# Patient Record
Sex: Male | Born: 1975 | Race: White | Hispanic: No | Smoking: Former smoker
Health system: Southern US, Community
[De-identification: ages and names within clinical notes are randomized; demographics above are authoritative.]

## PROBLEM LIST (undated history)

## (undated) DIAGNOSIS — T7840XA Allergy, unspecified, initial encounter: Secondary | ICD-10-CM

## (undated) DIAGNOSIS — Y249XXA Unspecified firearm discharge, undetermined intent, initial encounter: Secondary | ICD-10-CM

## (undated) DIAGNOSIS — F3132 Bipolar disorder, current episode depressed, moderate: Secondary | ICD-10-CM

## (undated) DIAGNOSIS — F419 Anxiety disorder, unspecified: Secondary | ICD-10-CM

## (undated) DIAGNOSIS — F329 Major depressive disorder, single episode, unspecified: Secondary | ICD-10-CM

## (undated) DIAGNOSIS — G8929 Other chronic pain: Secondary | ICD-10-CM

## (undated) DIAGNOSIS — F32A Depression, unspecified: Secondary | ICD-10-CM

## (undated) DIAGNOSIS — W3400XA Accidental discharge from unspecified firearms or gun, initial encounter: Secondary | ICD-10-CM

## (undated) DIAGNOSIS — F319 Bipolar disorder, unspecified: Secondary | ICD-10-CM

## (undated) DIAGNOSIS — K219 Gastro-esophageal reflux disease without esophagitis: Secondary | ICD-10-CM

## (undated) DIAGNOSIS — G43909 Migraine, unspecified, not intractable, without status migrainosus: Secondary | ICD-10-CM

## (undated) DIAGNOSIS — M549 Dorsalgia, unspecified: Secondary | ICD-10-CM

## (undated) DIAGNOSIS — I10 Essential (primary) hypertension: Secondary | ICD-10-CM

## (undated) HISTORY — PX: OTHER SURGICAL HISTORY: SHX169

## (undated) HISTORY — DX: Allergy, unspecified, initial encounter: T78.40XA

## (undated) HISTORY — PX: BACK SURGERY: SHX140

---

## 1999-04-06 ENCOUNTER — Encounter: Payer: Self-pay | Admitting: Emergency Medicine

## 1999-04-06 ENCOUNTER — Emergency Department (HOSPITAL_COMMUNITY): Admission: EM | Admit: 1999-04-06 | Discharge: 1999-04-06 | Payer: Self-pay

## 2010-10-21 ENCOUNTER — Emergency Department (HOSPITAL_COMMUNITY): Payer: Self-pay

## 2010-10-21 ENCOUNTER — Emergency Department (HOSPITAL_COMMUNITY)
Admission: EM | Admit: 2010-10-21 | Discharge: 2010-10-21 | Disposition: A | Discharge status: Home / Self Care | Payer: Self-pay | Attending: Emergency Medicine | Admitting: Emergency Medicine

## 2010-10-21 DIAGNOSIS — R110 Nausea: Secondary | ICD-10-CM | POA: Insufficient documentation

## 2010-10-21 DIAGNOSIS — K2990 Gastroduodenitis, unspecified, without bleeding: Secondary | ICD-10-CM | POA: Insufficient documentation

## 2010-10-21 DIAGNOSIS — K2970 Gastritis, unspecified, without bleeding: Secondary | ICD-10-CM | POA: Insufficient documentation

## 2010-10-21 DIAGNOSIS — J029 Acute pharyngitis, unspecified: Secondary | ICD-10-CM | POA: Insufficient documentation

## 2010-10-21 DIAGNOSIS — M542 Cervicalgia: Secondary | ICD-10-CM | POA: Insufficient documentation

## 2010-10-21 DIAGNOSIS — I10 Essential (primary) hypertension: Secondary | ICD-10-CM | POA: Insufficient documentation

## 2010-10-21 DIAGNOSIS — R0789 Other chest pain: Secondary | ICD-10-CM | POA: Insufficient documentation

## 2010-10-21 DIAGNOSIS — I498 Other specified cardiac arrhythmias: Secondary | ICD-10-CM | POA: Insufficient documentation

## 2010-10-21 DIAGNOSIS — R1013 Epigastric pain: Secondary | ICD-10-CM | POA: Insufficient documentation

## 2010-10-21 LAB — CBC
HCT: 43 % (ref 39.0–52.0)
Hemoglobin: 14.7 g/dL (ref 13.0–17.0)
MCH: 28.3 pg (ref 26.0–34.0)
MCHC: 34.2 g/dL (ref 30.0–36.0)
MCV: 82.7 fL (ref 78.0–100.0)
Platelets: 199 10*3/uL (ref 150–400)
RBC: 5.2 MIL/uL (ref 4.22–5.81)
RDW: 14 % (ref 11.5–15.5)
WBC: 7.6 10*3/uL (ref 4.0–10.5)

## 2010-10-21 LAB — OCCULT BLOOD, POC DEVICE: Fecal Occult Bld: NEGATIVE

## 2010-10-21 LAB — POCT I-STAT, CHEM 8
BUN: 17 mg/dL (ref 6–23)
Calcium, Ion: 1.19 mmol/L (ref 1.12–1.32)
Chloride: 101 mEq/L (ref 96–112)
Creatinine, Ser: 1 mg/dL (ref 0.4–1.5)
Glucose, Bld: 92 mg/dL (ref 70–99)
HCT: 45 % (ref 39.0–52.0)
Hemoglobin: 15.3 g/dL (ref 13.0–17.0)
Potassium: 4.2 mEq/L (ref 3.5–5.1)
Sodium: 137 mEq/L (ref 135–145)
TCO2: 29 mmol/L (ref 0–100)

## 2010-10-21 LAB — POCT CARDIAC MARKERS
CKMB, poc: 1.8 ng/mL (ref 1.0–8.0)
Myoglobin, poc: 48.8 ng/mL (ref 12–200)
Troponin i, poc: <0.05 ng/mL (ref 0.00–0.09)

## 2011-04-22 ENCOUNTER — Emergency Department (HOSPITAL_COMMUNITY): Payer: PRIVATE HEALTH INSURANCE

## 2011-04-22 ENCOUNTER — Encounter (HOSPITAL_COMMUNITY): Payer: Self-pay | Admitting: Radiology

## 2011-04-22 ENCOUNTER — Emergency Department (HOSPITAL_COMMUNITY)
Admission: EM | Admit: 2011-04-22 | Discharge: 2011-04-22 | Disposition: A | Discharge status: Home / Self Care | Payer: PRIVATE HEALTH INSURANCE | Attending: Emergency Medicine | Admitting: Emergency Medicine

## 2011-04-22 DIAGNOSIS — K5900 Constipation, unspecified: Secondary | ICD-10-CM | POA: Insufficient documentation

## 2011-04-22 DIAGNOSIS — M545 Low back pain, unspecified: Secondary | ICD-10-CM | POA: Insufficient documentation

## 2011-04-22 DIAGNOSIS — I10 Essential (primary) hypertension: Secondary | ICD-10-CM | POA: Insufficient documentation

## 2011-04-22 DIAGNOSIS — M5126 Other intervertebral disc displacement, lumbar region: Secondary | ICD-10-CM | POA: Insufficient documentation

## 2011-04-22 DIAGNOSIS — Z79899 Other long term (current) drug therapy: Secondary | ICD-10-CM | POA: Insufficient documentation

## 2011-04-22 DIAGNOSIS — G8929 Other chronic pain: Secondary | ICD-10-CM | POA: Insufficient documentation

## 2011-04-22 DIAGNOSIS — R109 Unspecified abdominal pain: Secondary | ICD-10-CM | POA: Insufficient documentation

## 2011-04-22 HISTORY — DX: Dorsalgia, unspecified: M54.9

## 2011-04-22 HISTORY — DX: Unspecified firearm discharge, undetermined intent, initial encounter: Y24.9XXA

## 2011-04-22 HISTORY — DX: Other chronic pain: G89.29

## 2011-04-22 HISTORY — DX: Essential (primary) hypertension: I10

## 2011-04-22 HISTORY — DX: Accidental discharge from unspecified firearms or gun, initial encounter: W34.00XA

## 2011-04-22 LAB — DIFFERENTIAL
Basophils Relative: 0 % (ref 0–1)
Eosinophils Absolute: 0.1 10*3/uL (ref 0.0–0.7)
Lymphs Abs: 1.6 10*3/uL (ref 0.7–4.0)
Monocytes Absolute: 0.6 10*3/uL (ref 0.1–1.0)
Monocytes Relative: 5 % (ref 3–12)
Neutro Abs: 9.2 10*3/uL — ABNORMAL HIGH (ref 1.7–7.7)

## 2011-04-22 LAB — POCT I-STAT, CHEM 8
BUN: 20 mg/dL (ref 6–23)
Calcium, Ion: 1.22 mmol/L (ref 1.12–1.32)
Chloride: 103 mEq/L (ref 96–112)
Creatinine, Ser: 0.7 mg/dL (ref 0.50–1.35)
TCO2: 27 mmol/L (ref 0–100)

## 2011-04-22 LAB — CBC
Hemoglobin: 16.9 g/dL (ref 13.0–17.0)
MCH: 29.4 pg (ref 26.0–34.0)
MCHC: 36.5 g/dL — ABNORMAL HIGH (ref 30.0–36.0)
MCV: 80.5 fL (ref 78.0–100.0)

## 2011-04-22 LAB — URINALYSIS, ROUTINE W REFLEX MICROSCOPIC
Glucose, UA: NEGATIVE mg/dL
Hgb urine dipstick: NEGATIVE
Ketones, ur: NEGATIVE mg/dL
Protein, ur: NEGATIVE mg/dL
pH: 6 (ref 5.0–8.0)

## 2011-07-15 ENCOUNTER — Encounter (HOSPITAL_COMMUNITY): Payer: Self-pay

## 2011-07-15 ENCOUNTER — Encounter (HOSPITAL_COMMUNITY): Payer: Self-pay | Admitting: Pharmacy Technician

## 2011-07-15 ENCOUNTER — Encounter (HOSPITAL_COMMUNITY)
Admission: RE | Admit: 2011-07-15 | Discharge: 2011-07-15 | Disposition: A | Discharge status: Home / Self Care | Payer: PRIVATE HEALTH INSURANCE | Source: Ambulatory Visit | Attending: Neurological Surgery | Admitting: Neurological Surgery

## 2011-07-15 HISTORY — DX: Anxiety disorder, unspecified: F41.9

## 2011-07-15 HISTORY — DX: Gastro-esophageal reflux disease without esophagitis: K21.9

## 2011-07-15 HISTORY — DX: Bipolar disorder, unspecified: F31.9

## 2011-07-15 HISTORY — DX: Depression, unspecified: F32.A

## 2011-07-15 HISTORY — DX: Major depressive disorder, single episode, unspecified: F32.9

## 2011-07-15 HISTORY — DX: Migraine, unspecified, not intractable, without status migrainosus: G43.909

## 2011-07-15 LAB — DIFFERENTIAL
Basophils Absolute: 0 10*3/uL (ref 0.0–0.1)
Basophils Relative: 0 % (ref 0–1)
Eosinophils Absolute: 0.2 10*3/uL (ref 0.0–0.7)
Eosinophils Relative: 4 % (ref 0–5)
Lymphocytes Relative: 38 % (ref 12–46)
Monocytes Absolute: 0.4 10*3/uL (ref 0.1–1.0)

## 2011-07-15 LAB — CBC
HCT: 41.7 % (ref 39.0–52.0)
MCH: 29.6 pg (ref 26.0–34.0)
MCHC: 35.3 g/dL (ref 30.0–36.0)
MCV: 84.1 fL (ref 78.0–100.0)
Platelets: 171 10*3/uL (ref 150–400)
RDW: 12.8 % (ref 11.5–15.5)
WBC: 5.6 10*3/uL (ref 4.0–10.5)

## 2011-07-15 LAB — BASIC METABOLIC PANEL
BUN: 6 mg/dL (ref 6–23)
CO2: 29 mEq/L (ref 19–32)
Calcium: 9.1 mg/dL (ref 8.4–10.5)
Chloride: 104 mEq/L (ref 96–112)
Creatinine, Ser: 0.77 mg/dL (ref 0.50–1.35)

## 2011-07-15 LAB — PROTIME-INR: Prothrombin Time: 14.8 seconds (ref 11.6–15.2)

## 2011-07-15 LAB — TYPE AND SCREEN: Antibody Screen: NEGATIVE

## 2011-07-15 LAB — SURGICAL PCR SCREEN: Staphylococcus aureus: NEGATIVE

## 2011-07-15 LAB — ABO/RH: ABO/RH(D): O POS

## 2011-07-15 LAB — APTT: aPTT: 31 seconds (ref 24–37)
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
## 2011-07-15 NOTE — Pre-Procedure Instructions (Signed)
20 Kwabena Strutz  07/15/2011   Your procedure is scheduled on:  December 20 [facility], Thursday  Report to [facility] at  12 NOON                                                                                                                                                                                                                   CALL THIS NUMBER IF YOU ARE UNABLE TO MAKE IT......[phone]   Remember:   Do not eat food:After Midnight  .  May have clear liquids: up to 4 Hours before arrival  Time  (8:00) am.  Clear liquids include soda, tea, black coffee, apple or grape juice, broth.  Take these medicines the morning of surgery with A SIP OF WATER: FLEXERIL              OXYCONTIN, PAXIL**   Do not wear jewelry, make-up or nail polish.  Do not wear lotions, powders, or perfumes. You may wear deodorant.  Do not shave 48 hours prior to surgery.  Do not bring valuables to the hospital.  Contacts, dentures or bridgework may not be worn into surgery.  Leave suitcase in the car. After surgery it may be brought to your room.  For patients admitted to the hospital, checkout time is 11:00 AM the day of discharge.   Patients discharged the day of surgery will not be allowed to drive home.  Name and phone number of your driver: SUSAN  Dicola  -- MOTHER  Special Instructions: CHG Shower Use Special Wash: 1/2 bottle night before surgery and 1/2 bottle morning of surgery.   Please read over the following fact sheets that you were given: Pain Booklet, MRSA Information and Surgical Site Infection Prevention

## 2011-07-21 MED ORDER — VANCOMYCIN HCL 500 MG IV SOLR
500.0000 mg | Freq: Once | INTRAVENOUS | Status: AC
  Administered 2011-07-22: 500 mg via INTRAVENOUS
  Filled 2011-07-21: qty 500

## 2011-07-22 ENCOUNTER — Encounter (HOSPITAL_COMMUNITY): Payer: Self-pay | Admitting: Neurological Surgery

## 2011-07-22 ENCOUNTER — Encounter (HOSPITAL_COMMUNITY)
Admission: RE | Disposition: A | Discharge status: Home / Self Care | Payer: Self-pay | Source: Ambulatory Visit | Attending: Neurological Surgery

## 2011-07-22 ENCOUNTER — Encounter (HOSPITAL_COMMUNITY): Payer: Self-pay | Admitting: Anesthesiology

## 2011-07-22 ENCOUNTER — Inpatient Hospital Stay (HOSPITAL_COMMUNITY)
Admission: RE | Admit: 2011-07-22 | Discharge: 2011-07-25 | DRG: 460 | Disposition: A | Discharge status: Home / Self Care | Payer: PRIVATE HEALTH INSURANCE | Source: Ambulatory Visit | Attending: Neurological Surgery | Admitting: Neurological Surgery

## 2011-07-22 ENCOUNTER — Ambulatory Visit (HOSPITAL_COMMUNITY): Payer: PRIVATE HEALTH INSURANCE

## 2011-07-22 ENCOUNTER — Ambulatory Visit (HOSPITAL_COMMUNITY): Payer: PRIVATE HEALTH INSURANCE | Admitting: Anesthesiology

## 2011-07-22 ENCOUNTER — Other Ambulatory Visit: Payer: Self-pay

## 2011-07-22 DIAGNOSIS — G43909 Migraine, unspecified, not intractable, without status migrainosus: Secondary | ICD-10-CM | POA: Diagnosis present

## 2011-07-22 DIAGNOSIS — M5126 Other intervertebral disc displacement, lumbar region: Secondary | ICD-10-CM | POA: Diagnosis present

## 2011-07-22 DIAGNOSIS — F3130 Bipolar disorder, current episode depressed, mild or moderate severity, unspecified: Secondary | ICD-10-CM | POA: Diagnosis present

## 2011-07-22 DIAGNOSIS — G8929 Other chronic pain: Secondary | ICD-10-CM | POA: Diagnosis present

## 2011-07-22 DIAGNOSIS — M47817 Spondylosis without myelopathy or radiculopathy, lumbosacral region: Principal | ICD-10-CM | POA: Diagnosis present

## 2011-07-22 DIAGNOSIS — F17200 Nicotine dependence, unspecified, uncomplicated: Secondary | ICD-10-CM | POA: Diagnosis present

## 2011-07-22 DIAGNOSIS — Z23 Encounter for immunization: Secondary | ICD-10-CM

## 2011-07-22 DIAGNOSIS — M51379 Other intervertebral disc degeneration, lumbosacral region without mention of lumbar back pain or lower extremity pain: Secondary | ICD-10-CM | POA: Diagnosis present

## 2011-07-22 DIAGNOSIS — K219 Gastro-esophageal reflux disease without esophagitis: Secondary | ICD-10-CM | POA: Diagnosis present

## 2011-07-22 DIAGNOSIS — Z981 Arthrodesis status: Secondary | ICD-10-CM

## 2011-07-22 DIAGNOSIS — Z01812 Encounter for preprocedural laboratory examination: Secondary | ICD-10-CM

## 2011-07-22 DIAGNOSIS — F411 Generalized anxiety disorder: Secondary | ICD-10-CM | POA: Diagnosis present

## 2011-07-22 DIAGNOSIS — M5137 Other intervertebral disc degeneration, lumbosacral region: Secondary | ICD-10-CM | POA: Diagnosis present

## 2011-07-22 HISTORY — DX: Bipolar disorder, current episode depressed, moderate: F31.32

## 2011-07-22 SURGERY — POSTERIOR LUMBAR FUSION 2 LEVEL
Anesthesia: General | Site: Back | Wound class: Clean

## 2011-07-22 MED ORDER — HYDROMORPHONE HCL PF 1 MG/ML IJ SOLN
INTRAMUSCULAR | Status: AC
  Administered 2011-07-22 (×2): 0.5 mg
  Filled 2011-07-22: qty 1

## 2011-07-22 MED ORDER — MENTHOL 3 MG MT LOZG: 1.0000 | LOZENGE | OROMUCOSAL | Status: DC | PRN

## 2011-07-22 MED ORDER — ONDANSETRON HCL 4 MG/2ML IJ SOLN
INTRAMUSCULAR | Status: DC | PRN
  Administered 2011-07-22: 4 mg via INTRAVENOUS

## 2011-07-22 MED ORDER — PROPOFOL 10 MG/ML IV EMUL
INTRAVENOUS | Status: DC | PRN
  Administered 2011-07-22: 200 mg via INTRAVENOUS

## 2011-07-22 MED ORDER — DEXAMETHASONE SODIUM PHOSPHATE 10 MG/ML IJ SOLN
INTRAMUSCULAR | Status: DC | PRN
  Administered 2011-07-22: 10 mg via INTRAVENOUS

## 2011-07-22 MED ORDER — SENNA 8.6 MG PO TABS
1.0000 | ORAL_TABLET | Freq: Two times a day (BID) | ORAL | Status: DC
  Administered 2011-07-22 – 2011-07-25 (×6): 8.6 mg via ORAL
  Filled 2011-07-22 (×7): qty 1

## 2011-07-22 MED ORDER — ALPRAZOLAM 0.5 MG PO TABS
1.0000 mg | ORAL_TABLET | Freq: Two times a day (BID) | ORAL | Status: DC
  Administered 2011-07-22 – 2011-07-25 (×6): 1 mg via ORAL
  Filled 2011-07-22 (×2): qty 1
  Filled 2011-07-22: qty 2
  Filled 2011-07-22: qty 1
  Filled 2011-07-22: qty 2
  Filled 2011-07-22 (×2): qty 1
  Filled 2011-07-22: qty 2
  Filled 2011-07-22: qty 1

## 2011-07-22 MED ORDER — DEXAMETHASONE 4 MG PO TABS
4.0000 mg | ORAL_TABLET | Freq: Four times a day (QID) | ORAL | Status: DC
  Administered 2011-07-23 – 2011-07-25 (×11): 4 mg via ORAL
  Filled 2011-07-22 (×15): qty 1

## 2011-07-22 MED ORDER — ONDANSETRON HCL 4 MG/2ML IJ SOLN: 4.0000 mg | INTRAMUSCULAR | Status: DC | PRN

## 2011-07-22 MED ORDER — HEMOSTATIC AGENTS (NO CHARGE) OPTIME
TOPICAL | Status: DC | PRN
  Administered 2011-07-22: 1 via TOPICAL

## 2011-07-22 MED ORDER — SODIUM CHLORIDE 0.9 % IR SOLN
Status: DC | PRN
  Administered 2011-07-22: 14:00:00

## 2011-07-22 MED ORDER — LACTATED RINGERS IV SOLN
INTRAVENOUS | Status: DC | PRN
  Administered 2011-07-22 (×3): via INTRAVENOUS

## 2011-07-22 MED ORDER — DIPHENHYDRAMINE HCL 50 MG/ML IJ SOLN: 12.5000 mg | Freq: Four times a day (QID) | INTRAMUSCULAR | Status: DC | PRN

## 2011-07-22 MED ORDER — NEOSTIGMINE METHYLSULFATE 1 MG/ML IJ SOLN
INTRAMUSCULAR | Status: DC | PRN
  Administered 2011-07-22: 4 mg via INTRAVENOUS

## 2011-07-22 MED ORDER — DEXAMETHASONE SODIUM PHOSPHATE 4 MG/ML IJ SOLN
4.0000 mg | Freq: Four times a day (QID) | INTRAMUSCULAR | Status: DC
  Filled 2011-07-22 (×15): qty 1

## 2011-07-22 MED ORDER — ACETAMINOPHEN 10 MG/ML IV SOLN
INTRAVENOUS | Status: AC
  Filled 2011-07-22: qty 100

## 2011-07-22 MED ORDER — VANCOMYCIN HCL IN DEXTROSE 1-5 GM/200ML-% IV SOLN
1000.0000 mg | Freq: Two times a day (BID) | INTRAVENOUS | Status: DC
  Administered 2011-07-22 – 2011-07-24 (×5): 1000 mg via INTRAVENOUS
  Filled 2011-07-22 (×7): qty 200

## 2011-07-22 MED ORDER — ROCURONIUM BROMIDE 100 MG/10ML IV SOLN
INTRAVENOUS | Status: DC | PRN
  Administered 2011-07-22 (×2): 10 mg via INTRAVENOUS
  Administered 2011-07-22: 50 mg via INTRAVENOUS
  Administered 2011-07-22 (×2): 10 mg via INTRAVENOUS
  Administered 2011-07-22: 20 mg via INTRAVENOUS
  Administered 2011-07-22: 10 mg via INTRAVENOUS

## 2011-07-22 MED ORDER — SODIUM CHLORIDE 0.9 % IV SOLN
INTRAVENOUS | Status: AC
  Filled 2011-07-22: qty 500

## 2011-07-22 MED ORDER — HYDROMORPHONE 0.3 MG/ML IV SOLN
INTRAVENOUS | Status: DC
  Administered 2011-07-22: 17:00:00 via INTRAVENOUS
  Administered 2011-07-22: 3.3 mg via INTRAVENOUS
  Administered 2011-07-23: 3.9 mg via INTRAVENOUS
  Administered 2011-07-23: 1.6 mg via INTRAVENOUS
  Administered 2011-07-23: 3.3 mg via INTRAVENOUS
  Administered 2011-07-23: 3.16 mg via INTRAVENOUS
  Administered 2011-07-23: 3.9 mg via INTRAVENOUS
  Administered 2011-07-23 (×2): via INTRAVENOUS
  Filled 2011-07-22 (×3): qty 25

## 2011-07-22 MED ORDER — GLYCOPYRROLATE 0.2 MG/ML IJ SOLN
INTRAMUSCULAR | Status: DC | PRN
  Administered 2011-07-22: .8 mg via INTRAVENOUS

## 2011-07-22 MED ORDER — BUPIVACAINE HCL (PF) 0.25 % IJ SOLN
INTRAMUSCULAR | Status: DC | PRN
  Administered 2011-07-22: 8 mL

## 2011-07-22 MED ORDER — MIDAZOLAM HCL 5 MG/5ML IJ SOLN
INTRAMUSCULAR | Status: DC | PRN
  Administered 2011-07-22: 2 mg via INTRAVENOUS

## 2011-07-22 MED ORDER — SODIUM CHLORIDE 0.9 % IV SOLN
INTRAVENOUS | Status: DC | PRN
  Administered 2011-07-22: 15:00:00 via INTRAVENOUS

## 2011-07-22 MED ORDER — THROMBIN 20000 UNITS EX KIT
PACK | CUTANEOUS | Status: DC | PRN
  Administered 2011-07-22: 20000 [IU] via TOPICAL

## 2011-07-22 MED ORDER — CYCLOBENZAPRINE HCL 10 MG PO TABS
10.0000 mg | ORAL_TABLET | Freq: Three times a day (TID) | ORAL | Status: DC | PRN
  Administered 2011-07-22 – 2011-07-24 (×4): 10 mg via ORAL
  Filled 2011-07-22 (×3): qty 1

## 2011-07-22 MED ORDER — 0.9 % SODIUM CHLORIDE (POUR BTL) OPTIME
TOPICAL | Status: DC | PRN
  Administered 2011-07-22: 1000 mL

## 2011-07-22 MED ORDER — SUFENTANIL CITRATE 50 MCG/ML IV SOLN
INTRAVENOUS | Status: DC | PRN
  Administered 2011-07-22 (×2): 10 ug via INTRAVENOUS
  Administered 2011-07-22 (×2): 20 ug via INTRAVENOUS
  Administered 2011-07-22: 10 ug via INTRAVENOUS

## 2011-07-22 MED ORDER — SODIUM CHLORIDE 0.9 % IV SOLN: 250.0000 mL | INTRAVENOUS | Status: DC

## 2011-07-22 MED ORDER — POTASSIUM CHLORIDE IN NACL 20-0.9 MEQ/L-% IV SOLN
INTRAVENOUS | Status: DC
  Administered 2011-07-22 – 2011-07-23 (×2): via INTRAVENOUS
  Filled 2011-07-22 (×6): qty 1000

## 2011-07-22 MED ORDER — OXYCODONE-ACETAMINOPHEN 5-325 MG PO TABS
ORAL_TABLET | ORAL | Status: AC
  Administered 2011-07-22: 2
  Filled 2011-07-22: qty 2

## 2011-07-22 MED ORDER — THROMBIN 20000 UNITS EX KIT
PACK | CUTANEOUS | Status: DC | PRN
  Administered 2011-07-22: 16:00:00 via TOPICAL

## 2011-07-22 MED ORDER — ZOLPIDEM TARTRATE 10 MG PO TABS
10.0000 mg | ORAL_TABLET | Freq: Every evening | ORAL | Status: DC | PRN
  Administered 2011-07-23: 10 mg via ORAL
  Filled 2011-07-22: qty 1

## 2011-07-22 MED ORDER — DIPHENHYDRAMINE HCL 12.5 MG/5ML PO ELIX: 12.5000 mg | ORAL_SOLUTION | Freq: Four times a day (QID) | ORAL | Status: DC | PRN

## 2011-07-22 MED ORDER — ACETAMINOPHEN 650 MG RE SUPP: 650.0000 mg | RECTAL | Status: DC | PRN

## 2011-07-22 MED ORDER — CYCLOBENZAPRINE HCL 10 MG PO TABS
ORAL_TABLET | ORAL | Status: AC
  Filled 2011-07-22: qty 1

## 2011-07-22 MED ORDER — NALOXONE HCL 0.4 MG/ML IJ SOLN: 0.4000 mg | INTRAMUSCULAR | Status: DC | PRN

## 2011-07-22 MED ORDER — THROMBIN 5000 UNITS EX KIT
PACK | OROMUCOSAL | Status: DC | PRN
  Administered 2011-07-22 (×2): via TOPICAL

## 2011-07-22 MED ORDER — PHENOL 1.4 % MT LIQD: 1.0000 | OROMUCOSAL | Status: DC | PRN

## 2011-07-22 MED ORDER — SODIUM CHLORIDE 0.9 % IJ SOLN: 9.0000 mL | INTRAMUSCULAR | Status: DC | PRN

## 2011-07-22 MED ORDER — PAROXETINE HCL 20 MG PO TABS
20.0000 mg | ORAL_TABLET | Freq: Every day | ORAL | Status: DC
  Administered 2011-07-23 – 2011-07-24 (×2): 40 mg via ORAL
  Filled 2011-07-22 (×2): qty 2

## 2011-07-22 MED ORDER — ONDANSETRON HCL 4 MG/2ML IJ SOLN: 4.0000 mg | Freq: Four times a day (QID) | INTRAMUSCULAR | Status: DC | PRN

## 2011-07-22 MED ORDER — SODIUM CHLORIDE 0.9 % IJ SOLN
3.0000 mL | INTRAMUSCULAR | Status: DC | PRN
  Administered 2011-07-22: 3 mL via INTRAVENOUS

## 2011-07-22 MED ORDER — ACETAMINOPHEN 10 MG/ML IV SOLN
INTRAVENOUS | Status: DC | PRN
  Administered 2011-07-22: 1000 mg via INTRAVENOUS

## 2011-07-22 MED ORDER — ACETAMINOPHEN 325 MG PO TABS: 650.0000 mg | ORAL_TABLET | ORAL | Status: DC | PRN

## 2011-07-22 MED ORDER — BACITRACIN 50000 UNITS IM SOLR
INTRAMUSCULAR | Status: AC
  Filled 2011-07-22: qty 50000

## 2011-07-22 MED ORDER — SODIUM CHLORIDE 0.9 % IJ SOLN
3.0000 mL | Freq: Two times a day (BID) | INTRAMUSCULAR | Status: DC
  Administered 2011-07-22: 3 mL via INTRAVENOUS

## 2011-07-22 MED ORDER — OXYCODONE-ACETAMINOPHEN 5-325 MG PO TABS
1.0000 | ORAL_TABLET | ORAL | Status: DC | PRN
  Administered 2011-07-22 – 2011-07-23 (×2): 2 via ORAL
  Administered 2011-07-23: 1 via ORAL
  Administered 2011-07-23: 2 via ORAL
  Administered 2011-07-23: 1 via ORAL
  Administered 2011-07-24 – 2011-07-25 (×4): 2 via ORAL
  Filled 2011-07-22 (×9): qty 2

## 2011-07-22 SURGICAL SUPPLY — 69 items
APL SKNCLS STERI-STRIP NONHPOA (GAUZE/BANDAGES/DRESSINGS) ×1
BAG DECANTER FOR FLEXI CONT (MISCELLANEOUS) ×2 IMPLANT
BENZOIN TINCTURE PRP APPL 2/3 (GAUZE/BANDAGES/DRESSINGS) ×2 IMPLANT
BLADE SURG ROTATE 9660 (MISCELLANEOUS) ×1 IMPLANT
BUR MATCHSTICK NEURO 3.0 LAGG (BURR) ×3 IMPLANT
CANISTER SUCTION 2500CC (MISCELLANEOUS) ×2 IMPLANT
CLOSURE STERI STRIP 1/2 X4 (GAUZE/BANDAGES/DRESSINGS) ×1 IMPLANT
CLOTH BEACON ORANGE TIMEOUT ST (SAFETY) ×2 IMPLANT
CONT SPEC 4OZ CLIKSEAL STRL BL (MISCELLANEOUS) ×4 IMPLANT
COVER BACK TABLE 24X17X13 BIG (DRAPES) ×1 IMPLANT
COVER TABLE BACK 60X90 (DRAPES) ×1 IMPLANT
DRAPE C-ARM 42X72 X-RAY (DRAPES) ×5 IMPLANT
DRAPE LAPAROTOMY 100X72X124 (DRAPES) ×2 IMPLANT
DRAPE POUCH INSTRU U-SHP 10X18 (DRAPES) ×2 IMPLANT
DRAPE SURG 17X23 STRL (DRAPES) ×2 IMPLANT
DRESSING TELFA 8X3 (GAUZE/BANDAGES/DRESSINGS) ×2 IMPLANT
DRSG OPSITE 4X5.5 SM (GAUZE/BANDAGES/DRESSINGS) ×3 IMPLANT
DURAPREP 26ML APPLICATOR (WOUND CARE) ×2 IMPLANT
ELECT REM PT RETURN 9FT ADLT (ELECTROSURGICAL) ×2
ELECTRODE REM PT RTRN 9FT ADLT (ELECTROSURGICAL) ×1 IMPLANT
EVACUATOR 1/8 PVC DRAIN (DRAIN) ×2 IMPLANT
GAUZE SPONGE 4X4 16PLY XRAY LF (GAUZE/BANDAGES/DRESSINGS) ×2 IMPLANT
GLOVE BIO SURGEON STRL SZ8 (GLOVE) ×4 IMPLANT
GLOVE BIOGEL PI IND STRL 7.0 (GLOVE) IMPLANT
GLOVE BIOGEL PI INDICATOR 7.0 (GLOVE) ×1
GLOVE ECLIPSE 7.5 STRL STRAW (GLOVE) ×2 IMPLANT
GLOVE ECLIPSE 8.0 STRL XLNG CF (GLOVE) ×2 IMPLANT
GLOVE ECLIPSE 8.5 STRL (GLOVE) IMPLANT
GLOVE EXAM NITRILE MD LF STRL (GLOVE) ×1 IMPLANT
GLOVE SURG SS PI 6.5 STRL IVOR (GLOVE) ×5 IMPLANT
GOWN BRE IMP SLV AUR LG STRL (GOWN DISPOSABLE) ×2 IMPLANT
GOWN BRE IMP SLV AUR XL STRL (GOWN DISPOSABLE) ×4 IMPLANT
GOWN STRL REIN 2XL LVL4 (GOWN DISPOSABLE) ×1 IMPLANT
GRAFT BN 5X1XSPNE CVD POST DBM (Bone Implant) IMPLANT
GRAFT BONE MAGNIFUSE 1X5CM (Bone Implant) ×2 IMPLANT
HEMOSTAT POWDER KIT SURGIFOAM (HEMOSTASIS) IMPLANT
KIT BASIN OR (CUSTOM PROCEDURE TRAY) ×2 IMPLANT
KIT ROOM TURNOVER OR (KITS) ×2 IMPLANT
MILL MEDIUM DISP (BLADE) ×1 IMPLANT
NDL HYPO 25X1 1.5 SAFETY (NEEDLE) ×1 IMPLANT
NEEDLE HYPO 25X1 1.5 SAFETY (NEEDLE) ×2 IMPLANT
NS IRRIG 1000ML POUR BTL (IV SOLUTION) ×2 IMPLANT
PACK LAMINECTOMY NEURO (CUSTOM PROCEDURE TRAY) ×2 IMPLANT
PAD ARMBOARD 7.5X6 YLW CONV (MISCELLANEOUS) ×8 IMPLANT
PUTTY 5ML ACTIFUSE ABX (Putty) ×1 IMPLANT
ROD TI 5.5X70 (Rod) ×2 IMPLANT
SCREW LOCK (Screw) ×12 IMPLANT
SCREW LOCK 100X5.5X OPN (Screw) IMPLANT
SCREW POLY 45X6.5 (Screw) IMPLANT
SCREW POLY 6.5X35 (Screw) ×2 IMPLANT
SCREW POLY 6.5X45MM (Screw) ×8 IMPLANT
SPONGE LAP 4X18 X RAY DECT (DISPOSABLE) ×1 IMPLANT
SPONGE SURGIFOAM ABS GEL 100 (HEMOSTASIS) ×2 IMPLANT
STRIP CLOSURE SKIN 1/2X4 (GAUZE/BANDAGES/DRESSINGS) ×4 IMPLANT
SUT VIC AB 0 CT1 18XCR BRD8 (SUTURE) ×1 IMPLANT
SUT VIC AB 0 CT1 8-18 (SUTURE) ×4
SUT VIC AB 2-0 CP2 18 (SUTURE) ×3 IMPLANT
SUT VIC AB 3-0 SH 8-18 (SUTURE) ×4 IMPLANT
SYR 20ML ECCENTRIC (SYRINGE) ×2 IMPLANT
TAPE CLOTH SURG 4X10 WHT LF (GAUZE/BANDAGES/DRESSINGS) ×1 IMPLANT
TELAMON 12X26 (Cage) ×1 IMPLANT
TOWEL OR 17X24 6PK STRL BLUE (TOWEL DISPOSABLE) ×2 IMPLANT
TOWEL OR 17X26 10 PK STRL BLUE (TOWEL DISPOSABLE) ×2 IMPLANT
TRAP SPECIMEN MUCOUS 40CC (MISCELLANEOUS) ×1 IMPLANT
TRAY FOLEY CATH 14FRSI W/METER (CATHETERS) ×2 IMPLANT
Telemon cage (Screw) ×1 IMPLANT
WATER STERILE IRR 1000ML POUR (IV SOLUTION) ×2 IMPLANT
WEDGE TANGENT 12X26MM ×1 IMPLANT
WEDGE TANGENT 8X26MM ×1 IMPLANT

## 2011-07-22 NOTE — Op Note (Signed)
07/22/2011  5:01 PM  PATIENT:  [name]  35 y.o. 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 Decompressive laminectomy L4 and L5 decompression of the L4, L5 and S1 nerve roots requiring more work than would be required of a simple PLIF procedure in order to adequately decompress the neural elements, 2. Posterior lumbar interbody fusion L4-5 L5-S1 utilizing peek interbody cages packed with local autograft and morcellized allograft, and tangent interbody bone wedges. 3. Posterior lateral intertransverse arthrodesis utilizing local autograft and morcellized L4-S1, 4. Segmental fixation L4-S1 utilizing nuvasive pedicle screws  SURGEON:  Marikay Alar, MD  ASSISTANTS: Kritzer  ANESTHESIA:   General  EBL: 2200 ml  Total I/O In: 4260 [I.V.:3300; Blood:960] Out: 3025 [Urine:825; Blood:2200]  BLOOD ADMINISTERED:960 CC CELLSAVER  DRAINS: hemovac   SPECIMEN:  No Specimen  INDICATION FOR PROCEDURE: This gentleman presented with severe back pain radiation into the legs for quite a long time. He tried medical management without relief. MRI showed severe spondylosis and degenerative disease oh for followup of S1. Recommended a PLIF L4-5 and L5-S1. Patient understood the risks, benefits, and alternatives and potential outcomes and wished to proceed.   PROCEDURE DETAILS:  The patient was brought to the operating room. After induction of generalized endotracheal anesthesia the patient was rolled into the prone position on chest rolls and all pressure points were padded. The patient's lumbar region was cleaned and then prepped with DuraPrep and draped in the usual sterile fashion. Anesthesia was injected and then a dorsal midline incision was made and carried down to the lumbosacral fascia. The fascia was opened and the paraspinous  musculature was taken down in a subperiosteal fashion to expose L4-5 and L5-S1. Intraoperative fluoroscopy confirmed my level, and the spinous process was removed and complete lumbar laminectomies, hemi- facetectomies, and foraminotomies were performed at L4-5 and L5-S1. The yellow ligament was removed to expose the underlying dura and nerve roots, and generous foraminotomies were performed to adequately decompress the neural elements. I undercut the L3 lamina and we decompressed the L4 roots by skeletonizing the pedicle. I also skeletonized L5 and S1 pedicles to fully decompress the L5 and S1 nerve roots . Once the decompression was complete, I turned my attention to the posterior lower lumbar interbody fusion. The epidural venous vasculature was coagulated and cut sharply. Disc space was incised and the initial discectomy was performed with pituitary rongeurs. The disc space was distracted with sequential distractors to a height of 8 mm L5-S1 and 12 mm L4-5. We then used a series of scrapers and shavers to prepare the endplates for fusion. The midline was prepared with Epstein curettes. Once the complete discectomy was finished, we packed an appropriate sized peek interbody cage with local autograft and morcellized allograft, gently retracted the nerve root, and tapped the cage into position at L4-5 and L5-S1. We also tapped a tangent interbody bone wedge into the opposite side. The midline was packed with morselized autograft and allograft. We then turned our attention to the posterior fixation. The pedicle screw entry zones were identified utilizing surface landmarks and fluoroscopy. We probed each pedicle with the pedicle probe and tapped each pedicle with the appropriate tap. We palpated with a ball probe to assure no break in the cortex. We then placed 6 5 x 45 mm screws into the pedicles bilaterally at L4 and L5. 6  5 x 35 mm pedicle screws were placed in the sacrum bilaterally.  We then decorticated the  transverse processes and laid a mixture of morcellized autograft and allograft out over these to perform intertransverse arthrodesis at L4-S1. We then placed lordotic rods into the multiaxial screw heads of the pedicle screws and locked these in position with the locking caps and anti-torque device. We achieved compression of our grafts. We then checked our construct with AP and lateral fluoroscopy. Irrigated with copious amounts of bacitracin-containing saline solution. Placed a medium Hemovac drain through separate stab incision. Inspected the nerve roots once again to assure adequate decompression, lined to the dura with Gelfoam, and closed the muscle and the fascia with 0 Vicryl. Closed the subcutaneous tissues with 2-0 Vicryl and subcuticular tissues with 3-0 Vicryl. The skin was closed with benzoin and Steri-Strips. Dressing was then applied, the patient was awakened from general anesthesia and transported to the recovery room in stable condition. At the end of the procedure all sponge, needle and instrument counts were correct.   PLAN OF CARE: Admit to inpatient   PATIENT DISPOSITION:  PACU - hemodynamically stable.   Delay start of Pharmacological VTE agent (>24hrs) due to surgical blood loss or risk of bleeding:  yes
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
## 2011-07-22 NOTE — Anesthesia Preprocedure Evaluation (Signed)
Anesthesia Evaluation  Patient identified by MRN, date of birth, ID band Patient awake    Reviewed: Allergy & Precautions, H&P , NPO status , Patient's Chart, lab work & pertinent test results  Airway Mallampati: II      Dental   Pulmonary neg pulmonary ROS, Current Smoker,  clear to auscultation        Cardiovascular Exercise Tolerance: Good neg cardio ROS Regular Normal    Neuro/Psych  Headaches,    GI/Hepatic Neg liver ROS, GERD-  ,  Endo/Other  Negative Endocrine ROS  Renal/GU negative Renal ROS     Musculoskeletal negative musculoskeletal ROS (+)   Abdominal   Peds  Hematology negative hematology ROS (+)   Anesthesia Other Findings   Reproductive/Obstetrics                           Anesthesia Physical Anesthesia Plan  ASA: II  Anesthesia Plan: General   Post-op Pain Management:    Induction: Intravenous  Airway Management Planned: Oral ETT  Additional Equipment:   Intra-op Plan:   Post-operative Plan: Extubation in OR  Informed Consent: I have reviewed the patients History and Physical, chart, labs and discussed the procedure including the risks, benefits and alternatives for the proposed anesthesia with the patient or authorized representative who has indicated his/her understanding and acceptance.     Plan Discussed with: CRNA  Anesthesia Plan Comments:         Anesthesia Quick Evaluation

## 2011-07-22 NOTE — Transfer of Care (Signed)
Immediate Anesthesia Transfer of Care Note  Patient: [name]  Procedure(s) Performed:  POSTERIOR LUMBAR FUSION 2 LEVEL - Lumbar four-five,Lumbar five-Sacral one Posterior Lumbar Interbody Fusion w/segmental fixation (Nuvasive screws / Tangent Interbody) Rm # 33 (start case at 1:00 p.m.)  Patient Location: PACU  Anesthesia Type: General  Level of Consciousness: sedated and patient cooperative  Airway & Oxygen Therapy: Patient Spontanous Breathing and Patient connected to nasal cannula oxygen  Post-op Assessment: Report given to PACU RN, Post -op Vital signs reviewed and stable and Patient moving all extremities X 4  Post vital signs: Reviewed and stable  Complications: No apparent anesthesia complications

## 2011-07-22 NOTE — Preoperative (Signed)
Beta Blockers   Reason not to administer Beta Blockers:Not Applicable 

## 2011-07-22 NOTE — Transfer of Care (Signed)
Immediate Anesthesia Transfer of Care Note  Patient: [name]  Procedure(s) Performed:  POSTERIOR LUMBAR FUSION 2 LEVEL - Lumbar four-five,Lumbar five-Sacral one Posterior Lumbar Interbody Fusion w/segmental fixation (Nuvasive screws / Tangent Interbody) Rm # 33 (start case at 1:00 p.m.)  Patient Location: PACU  Anesthesia Type: General  Level of Consciousness: awake  Airway & Oxygen Therapy: Patient Spontanous Breathing  Post-op Assessment: Report given to PACU RN  Post vital signs: stable  Complications: No apparent anesthesia complications
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
## 2011-07-22 NOTE — Progress Notes (Signed)
Mother stepped out o go to doctors appointment.  If she returns in time before surgery she will take belongings otherwise they will be sent to PACU

## 2011-07-22 NOTE — Anesthesia Postprocedure Evaluation (Signed)
  Anesthesia Post-op Note  Patient: [name]  Procedure(s) Performed:  POSTERIOR LUMBAR FUSION 2 LEVEL - Lumbar four-five,Lumbar five-Sacral one Posterior Lumbar Interbody Fusion w/segmental fixation (Nuvasive screws / Tangent Interbody) Rm # 33 (start case at 1:00 p.m.)  Patient Location: PACU  Anesthesia Type: General  Level of Consciousness: awake  Airway and Oxygen Therapy: Patient Spontanous Breathing  Post-op Pain: mild  Post-op Assessment: Post-op Vital signs reviewed  Post-op Vital Signs: stable  Complications: No apparent anesthesia complications

## 2011-07-22 NOTE — Anesthesia Procedure Notes (Signed)
Procedure Name: Intubation Date/Time: 07/22/2011 12:48 PM Performed by: Gwenyth Allegra Pre-anesthesia Checklist: Patient identified, Timeout performed, Emergency Drugs available, Suction available and Patient being monitored Patient Re-evaluated:Patient Re-evaluated prior to inductionOxygen Delivery Method: Circle System Utilized Preoxygenation: Pre-oxygenation with 100% oxygen Intubation Type: IV induction Ventilation: Mask ventilation without difficulty and Oral airway inserted - appropriate to patient size Laryngoscope Size: Mac and 3 Grade View: Grade II Tube type: Oral Tube size: 8.0 mm Airway Equipment and Method: stylet Placement Confirmation: ETT inserted through vocal cords under direct vision,  positive ETCO2 and breath sounds checked- equal and bilateral Secured at: 22 cm Tube secured with: Tape Dental Injury: Teeth and Oropharynx as per pre-operative assessment

## 2011-07-23 ENCOUNTER — Encounter (HOSPITAL_COMMUNITY): Payer: Self-pay

## 2011-07-23 LAB — CBC
HCT: 35.7 % — ABNORMAL LOW (ref 39.0–52.0)
Hemoglobin: 12.6 g/dL — ABNORMAL LOW (ref 13.0–17.0)
MCHC: 35.3 g/dL (ref 30.0–36.0)
MCV: 83.2 fL (ref 78.0–100.0)

## 2011-07-23 MED ORDER — MIDAZOLAM HCL 2 MG/2ML IJ SOLN: 0.5000 mg | Freq: Once | INTRAMUSCULAR | Status: AC | PRN

## 2011-07-23 MED ORDER — SODIUM CHLORIDE 0.9 % IV SOLN
INTRAVENOUS | Status: DC
  Administered 2011-07-23: 11:00:00 via INTRAVENOUS

## 2011-07-23 MED ORDER — PROMETHAZINE HCL 25 MG/ML IJ SOLN: 6.2500 mg | INTRAMUSCULAR | Status: DC | PRN

## 2011-07-23 MED ORDER — HYDROMORPHONE HCL PF 1 MG/ML IJ SOLN: 0.2500 mg | INTRAMUSCULAR | Status: DC | PRN

## 2011-07-23 MED ORDER — MEPERIDINE HCL 25 MG/ML IJ SOLN: 6.2500 mg | INTRAMUSCULAR | Status: DC | PRN

## 2011-07-23 MED ORDER — MORPHINE SULFATE 2 MG/ML IJ SOLN: 0.0500 mg/kg | INTRAMUSCULAR | Status: DC | PRN

## 2011-07-23 MED ORDER — LACTATED RINGERS IV SOLN: INTRAVENOUS | Status: DC

## 2011-07-23 MED FILL — Sodium Chloride Irrigation Soln 0.9%: Qty: 3000 | Status: AC

## 2011-07-23 MED FILL — Sodium Chloride IV Soln 0.9%: INTRAVENOUS | Qty: 1000 | Status: AC

## 2011-07-23 MED FILL — Heparin Sodium (Porcine) Inj 1000 Unit/ML: INTRAMUSCULAR | Qty: 30 | Status: AC

## 2011-07-23 NOTE — Plan of Care (Signed)
Problem: Consults Goal: Diagnosis - Spinal Surgery Outcome: Completed/Met Date Met:  07/23/11 L4 - S1 PLIF

## 2011-07-23 NOTE — Progress Notes (Signed)
Patient ID: WING SCHOCH, male   DOB: 1975-11-20, 35 y.o.   MRN: [id number] Subjective: Patient reports appropriate back soreness, no leg pain. amb times oone today.  Objective: Vital signs in last 24 hours: Temp:  [97.2 F (36.2 C)-99.1 F (37.3 C)] 97.9 F (36.6 C) (12/21 0600) Pulse Rate:  [74-115] 97  (12/21 0600) Resp:  [8-20] 18  (12/21 0600) BP: (111-156)/(55-99) 111/56 mmHg (12/21 0600) SpO2:  [91 %-100 %] 99 % (12/21 0600) Weight:  [118 kg (260 lb 2.3 oz)] 260 lb 2.3 oz (118 kg) (12/20 1836)  Intake/Output from previous day: 12/20 0701 - 12/21 0700 In: 5636.8 [I.V.:4061.8; Blood:960; IV Piggyback:200] Out: 5100 [Urine:2850; Blood:2250] Intake/Output this shift:    Neurologic: Alert and oriented X 3, normal strength and tone. Normal symmetric reflexes. Normal coordination and gait Motor: Normal  Lab Results: Lab Results  Component Value Date   WBC 13.1* 07/23/2011   HGB 12.6* 07/23/2011   HCT 35.7* 07/23/2011   MCV 83.2 07/23/2011   PLT 141* 07/23/2011   Lab Results  Component Value Date   INR 1.14 07/15/2011   BMET Lab Results  Component Value Date   NA 142 07/15/2011   K 4.8 07/15/2011   CL 104 07/15/2011   CO2 29 07/15/2011   GLUCOSE 80 07/15/2011   BUN 6 07/15/2011   CREATININE 0.77 07/15/2011   CALCIUM 9.1 07/15/2011    Studies/Results: Dg Chest 2 View  07/22/2011  *RADIOLOGY REPORT*  Clinical Data: PLIF.  Hypertension.  Smoker  CHEST - 2 VIEW  Comparison: 10/21/2010  Findings:  Heart size appears normal.  There is no pleural effusion or pulmonary edema.  No airspace consolidation identified.  IMPRESSION:  1.  No active cardiopulmonary abnormalities  Original Report Authenticated By: Rosealee Albee, M.D.   Dg Lumbar Spine 2-3 Views  07/22/2011  *RADIOLOGY REPORT*  Clinical Data: L4-S1 PLIF.  LUMBAR SPINE - 2-3 VIEW  Comparison: MRI lumbar spine 04/22/2011.  Findings: We are provided four fluoroscopic intraoperative with spot views of the  lumbar spine.  Images demonstrate placement of pedicle screws and stabilization bars with interbody spacers from L4-S1.  No evidence of complication is identified.  IMPRESSION: L4-S1 PLIF.  Original Report Authenticated By: Bernadene Bell. D'ALESSIO, M.D.   Dg C-arm Leonia Reeves 120 Min  07/22/2011  CLINICAL DATA: L4-S1 PLIF   C-ARM GT 120 MIN  Fluoroscopy was utilized by the requesting physician.  No radiographic  interpretation.      Assessment/Plan: Doing well. Good spirits. PT OT today.   LOS: 1 day    JONES,DAVID S 07/23/2011, 7:57 AM
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
## 2011-07-23 NOTE — Progress Notes (Signed)
Hemovac drained a total of 415 ml of blood. Thanks

## 2011-07-24 MED ORDER — PAROXETINE HCL 20 MG PO TABS
40.0000 mg | ORAL_TABLET | Freq: Every day | ORAL | Status: DC
  Administered 2011-07-25: 40 mg via ORAL
  Filled 2011-07-24: qty 2

## 2011-07-24 MED ORDER — HYDROMORPHONE HCL PF 1 MG/ML IJ SOLN
1.0000 mg | INTRAMUSCULAR | Status: DC | PRN
  Administered 2011-07-24 – 2011-07-25 (×5): 1 mg via INTRAVENOUS
  Filled 2011-07-24 (×5): qty 1

## 2011-07-24 MED ORDER — BISACODYL 5 MG PO TBEC: 5.0000 mg | DELAYED_RELEASE_TABLET | Freq: Every day | ORAL | Status: DC | PRN
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
## 2011-07-24 NOTE — Progress Notes (Signed)
Subjective: Patient reports Patient feels overall pretty good legs feel better he is ambulating well passing gas  Objective: Vital signs in last 24 hours: Temp:  [97.6 F (36.4 C)-98.2 F (36.8 C)] 97.7 F (36.5 C) (12/22 0627) Pulse Rate:  [66-86] 66  (12/22 0627) Resp:  [16-24] 24  (12/22 0627) BP: (95-111)/(57-73) 98/57 mmHg (12/22 0627) SpO2:  [91 %-98 %] 98 % (12/22 0627)  Intake/Output from previous day: 12/21 0701 - 12/22 0700 In: 1510 [P.O.:1110; IV Piggyback:400] Out: 1450 [Urine:1300; Drains:150] Intake/Output this shift:    Strength out of 5 wound clean and dry drainage but of 150  Lab Results:  [facility] 07/23/11 0625  WBC 13.1*  HGB 12.6*  HCT 35.7*  PLT 141*   BMET No results found for this basename: NA:2,K:2,CL:2,CO2:2,GLUCOSE:2,BUN:2,CREATININE:2,CALCIUM:2 in the last 72 hours  Studies/Results: Dg Chest 2 View  07/22/2011  *RADIOLOGY REPORT*  Clinical Data: PLIF.  Hypertension.  Smoker  CHEST - 2 VIEW  Comparison: 10/21/2010  Findings:  Heart size appears normal.  There is no pleural effusion or pulmonary edema.  No airspace consolidation identified.  IMPRESSION:  1.  No active cardiopulmonary abnormalities  Original Report Authenticated By: Rosealee Albee, M.D.   Dg Lumbar Spine 2-3 Views  07/22/2011  *RADIOLOGY REPORT*  Clinical Data: L4-S1 PLIF.  LUMBAR SPINE - 2-3 VIEW  Comparison: MRI lumbar spine 04/22/2011.  Findings: We are provided four fluoroscopic intraoperative with spot views of the lumbar spine.  Images demonstrate placement of pedicle screws and stabilization bars with interbody spacers from L4-S1.  No evidence of complication is identified.  IMPRESSION: L4-S1 PLIF.  Original Report Authenticated By: Bernadene Bell. D'ALESSIO, M.D.   Dg C-arm Leonia Reeves 120 Min  07/22/2011  CLINICAL DATA: L4-S1 PLIF   C-ARM GT 120 MIN  Fluoroscopy was utilized by the requesting physician.  No radiographic  interpretation.      Assessment/Plan: Progress mobilization  today DC his PCA was we'll DC tomorrow  LOS: 2 days     Stormey Wilborn P 07/24/2011, 9:12 AM

## 2011-07-24 NOTE — Progress Notes (Signed)
Physical Therapy Treatment Patient Details Name: KEYMARI SATO MRN: [id number] DOB: Jan 30, 1976 Today's Date: 07/24/2011  PT Assessment/Plan  PT - Assessment/Plan PT Frequency: Min 5X/week Follow Up Recommendations: None Equipment Recommended: None recommended by PT PT Goals  Acute Rehab PT Goals PT Goal: Supine/Side to Sit - Progress: Progressing toward goal PT Goal: Sit to Stand - Progress: Progressing toward goal PT Goal: Stand to Sit - Progress: Progressing toward goal PT Goal: Ambulate - Progress: Progressing toward goal PT Goal: Up/Down Stairs - Progress: Progressing toward goal  PT Treatment Precautions/Restrictions  Precautions Precautions: Back Precaution Booklet Issued: Yes (comment) Precaution Comments: Pt independently verbalized 3/3 back precautions & did well with maintaining precautions with activity today.   Required Braces or Orthoses: Yes Spinal Brace: Lumbar corset;Applied in sitting position Restrictions Weight Bearing Restrictions: No Mobility (including Balance) Bed Mobility Rolling Left Details (indicate cue type and reason): pt lying on left side upon arrival.   Left Sidelying to Sit: 6: Modified independent (Device/Increase time);HOB flat Left Sidelying to Sit Details (indicate cue type and reason): performed without rails & HOB flat Transfers Sit to Stand: From bed;With upper extremity assist;5: Supervision;From chair/3-in-1;With armrests Sit to Stand Details (indicate cue type and reason): Cues for hand placement Stand to Sit: 5: Supervision;To chair/3-in-1;With upper extremity assist;With armrests Stand to Sit Details: Cues for hand placement Ambulation/Gait Ambulation/Gait Assistance: Other (comment) (Min Guard (A)) Ambulation/Gait Assistance Details (indicate cue type and reason): ambulated with no AD; (S) for safety- cues to keep head up for increased awareness of environment.  Pt has mild lateral sway but no LOB noted.  Performed  vertical/horizontal head turns.   Ambulation Distance (Feet): 300 Feet Assistive device: None Gait Pattern: Step-through pattern Stairs: Yes Stairs Assistance: 5: Supervision Stairs Assistance Details (indicate cue type and reason): Pt initially ascending/descending with alternating pattern but encouraged pt to take one step at a time (step-to pattern) to increase safety.  Pt with increased stability with step to pattern.   Stair Management Technique: No rails;Alternating pattern;Step to pattern;Forwards Number of Stairs: 4     Exercise    End of Session PT - End of Session Equipment Utilized During Treatment: Gait belt;Back brace Activity Tolerance: Patient tolerated treatment well Patient left: in chair;with call bell in reach;Other (comment) (OT present) General Behavior During Session: [facility] for tasks performed Cognition: [facility] for tasks performed  Lara Mulch 07/24/2011, 2:28 PM [phone]

## 2011-07-25 MED ORDER — OXYCODONE-ACETAMINOPHEN 5-325 MG PO TABS: 1.0000 | ORAL_TABLET | Freq: Four times a day (QID) | ORAL | Status: AC | PRN

## 2011-07-25 NOTE — Progress Notes (Signed)
Occupational Therapy Treatment Patient Details Name: [name] MRN: [id number] DOB: 1976/05/23 Today's Date: 07/25/2011  OT Assessment/Plan OT Assessment/Plan Comments on Treatment Session: Pt. moving well and anticipated D/C today. OT Plan: Discharge plan remains appropriate OT Frequency: Min 2X/week Follow Up Recommendations: None Equipment Recommended: None recommended by OT OT Goals Acute Rehab OT Goals OT Goal Formulation: With patient Time For Goal Achievement: 7 days ADL Goals Pt Will Perform Lower Body Bathing: Sit to stand from chair;with modified independence ADL Goal: Lower Body Bathing - Progress: Progressing toward goals Pt Will Perform Lower Body Dressing: with modified independence;Sit to stand from chair;with adaptive equipment ADL Goal: Lower Body Dressing - Progress: Progressing toward goals Pt Will Perform Tub/Shower Transfer: with modified independence;Tub transfer;Shower seat with back;with DME ADL Goal: Web designer - Progress: Progressing toward goals  OT Treatment Precautions/Restrictions  Precautions Precautions: Back Precaution Booklet Issued: Yes (comment) Precaution Comments: Pt independently verbalized 3/3 back precautions & did well with maintaining precautions with activity today.   Required Braces or Orthoses: Yes Spinal Brace: Lumbar corset;Applied in sitting position Restrictions Weight Bearing Restrictions: No   ADL ADL Tub/Shower Transfer: Supervision/safety;Simulated Tub/Shower Transfer Method: Science writer: Shower seat with back ADL Comments: Pt. following back precautions during session and completed Tub transfer without verbal cuing of technique and able to recall technique. Close supervision due to recently receiving pain medications and higher level balance affected.  Mobility  Bed Mobility Bed Mobility: Yes Rolling Left: 6: Modified independent (Device/Increase time) Left Sidelying to  Sit: 6: Modified independent (Device/Increase time);HOB flat Sit to Supine - Left: 6: Modified independent (Device/Increase time);HOB flat Transfers Transfers: Yes Sit to Stand: 6: Modified independent (Device/Increase time);From bed;With upper extremity assist Stand to Sit: 6: Modified independent (Device/Increase time);With upper extremity assist;To bed     End of Session OT - End of Session Equipment Utilized During Treatment: Gait belt;Back brace Activity Tolerance: Patient tolerated treatment well Patient left: in chair;with call bell in reach Nurse Communication: Mobility status for transfers General Behavior During Session: [facility] for tasks performed Cognition: [facility] for tasks performed  Cassandria Anger, OTR/L Pager [phone]  07/25/2011, 12:27 PM

## 2011-07-25 NOTE — Progress Notes (Signed)
Physical Therapy Treatment Patient Details Name: [name] MRN: [id number] DOB: 23-Feb-1976 Today's Date: 07/25/2011  PT Assessment/Plan  PT - Assessment/Plan Comments on Treatment Session: Pt doing well with mobility.  Per MD, plans are for pt to d/c home today.  Pt states he continues to feel stronger each day.   PT Frequency: Min 5X/week Follow Up Recommendations: None Equipment Recommended: None recommended by PT PT Goals  Acute Rehab PT Goals PT Goal: Rolling Supine to Left Side - Progress: Met PT Goal: Supine/Side to Sit - Progress: Met PT Goal: Sit to Supine/Side - Progress: Met PT Goal: Sit to Stand - Progress: Met PT Goal: Stand to Sit - Progress: Met PT Goal: Ambulate - Progress: Progressing toward goal  PT Treatment Precautions/Restrictions  Precautions Precautions: Back Precaution Booklet Issued: Yes (comment) Precaution Comments: Pt independently verbalized 3/3 back precautions & did well with maintaining precautions with activity today.   Required Braces or Orthoses: Yes Spinal Brace: Lumbar corset;Applied in sitting position Restrictions Weight Bearing Restrictions: No Mobility (including Balance) Bed Mobility Rolling Left: 6: Modified independent (Device/Increase time) Left Sidelying to Sit: 6: Modified independent (Device/Increase time);HOB flat Sit to Supine - Left: 6: Modified independent (Device/Increase time);HOB flat Transfers Sit to Stand: 6: Modified independent (Device/Increase time);From bed;With upper extremity assist Stand to Sit: 6: Modified independent (Device/Increase time);With upper extremity assist;To bed Ambulation/Gait Ambulation/Gait Assistance: 5: Supervision Ambulation/Gait Assistance Details (indicate cue type and reason): (S) for safety Ambulation Distance (Feet): 250 Feet Assistive device: None Gait Pattern: Step-through pattern Stairs: No Wheelchair Mobility Wheelchair Mobility: No    Exercise    End of Session PT -  End of Session Equipment Utilized During Treatment: Gait belt;Back brace Activity Tolerance: Patient tolerated treatment well Patient left: in bed (sitting EOB with RN present) General Behavior During Session: [facility] for tasks performed Cognition: [facility] for tasks performed  Lara Mulch 07/25/2011, 11:11 AM [phone]

## 2011-07-25 NOTE — Discharge Summary (Signed)
Physician Discharge Summary  Patient ID: [name] MRN: [id number] DOB/AGE: 1975/11/29 35 y.o.  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 The patient tolerated the procedure well and was taken to the recovery room and then to the floor in stable condition. The hospital course was routine. There were no complications. The wound remained clean dry and intact. The patient remained afebrile with stable vital signs, and tolerated a regular diet. The patient continued to increase activities with PT/OT, and pain was well controlled with oral pain medications.   Consults: none  Significant Diagnostic Studies:  Results for orders placed during the hospital encounter of 07/22/11  CBC      Component Value Range   WBC 13.1 (*) 4.0 - 10.5 (K/uL)   RBC 4.29  4.22 - 5.81 (MIL/uL)   Hemoglobin 12.6 (*) 13.0 - 17.0 (g/dL)   HCT 54.2 (*) 70.6 - 52.0 (%)   MCV 83.2  78.0 - 100.0 (fL)   MCH 29.4  26.0 - 34.0 (pg)   MCHC 35.3  30.0 - 36.0 (g/dL)   RDW 23.7  62.8 - 31.5 (%)   Platelets 141 (*) 150 - 400 (K/uL)    Dg Chest 2 View  07/22/2011  *RADIOLOGY REPORT*  Clinical Data: PLIF.  Hypertension.  Smoker  CHEST - 2 VIEW  Comparison: 10/21/2010  Findings:  Heart size appears normal.  There is no pleural effusion or pulmonary edema.  No airspace consolidation identified.  IMPRESSION:  1.  No active cardiopulmonary abnormalities  Original Report Authenticated By: Rosealee Albee, M.D.   Dg Lumbar Spine 2-3 Views  07/22/2011  *RADIOLOGY REPORT*  Clinical Data: L4-S1 PLIF.  LUMBAR SPINE - 2-3 VIEW  Comparison: MRI lumbar spine 04/22/2011.  Findings: We are provided four fluoroscopic intraoperative with spot views of the lumbar spine.  Images  demonstrate placement of pedicle screws and stabilization bars with interbody spacers from L4-S1.  No evidence of complication is identified.  IMPRESSION: L4-S1 PLIF.  Original Report Authenticated By: Bernadene Bell. D'ALESSIO, M.D.   Dg C-arm Leonia Reeves 120 Min  07/22/2011  CLINICAL DATA: L4-S1 PLIF   C-ARM GT 120 MIN  Fluoroscopy was utilized by the requesting physician.  No radiographic  interpretation.      Antibiotics:  Anti-infectives     Start     Dose/Rate Route Frequency Ordered Stop   07/22/11 2100   vancomycin (VANCOCIN) IVPB 1000 mg/200 mL premix        1,000 mg 200 mL/hr over 60 Minutes Intravenous Every 12 hours 07/22/11 1855     07/22/11 1353   bacitracin 50,000 Units in sodium chloride irrigation 0.9 % 500 mL irrigation  Status:  Discontinued          As needed 07/22/11 1353 07/22/11 1709   07/22/11 1209   bacitracin 17616 UNITS injection     Comments: DAY, DORY: cabinet override         07/22/11 1209 07/23/11 0014   07/21/11 1530   vancomycin (VANCOCIN) 500 mg in sodium chloride 0.9 % 100 mL IVPB        500 mg 100 mL/hr over 60 Minutes Intravenous  Once 07/21/11 1516 07/22/11 1215          Discharge Exam: Blood pressure 140/86, pulse 72, temperature  97.8 F (36.6 C), temperature source Oral, resp. rate 18, height 5\' 10"  (1.778 m), weight 118 kg (260 lb 2.3 oz), SpO2 98.00%. Neurologic: Grossly normal Wound CDI Good strength, ambulation  Discharge Medications:   Current Discharge Medication List    START taking these medications   Details  oxyCODONE-acetaminophen (PERCOCET) 5-325 MG per tablet Take 1-2 tablets by mouth every 6 (six) hours as needed (breakthrough pain). Qty: 90 tablet, Refills: 0      CONTINUE these medications which have NOT CHANGED   Details  ALPRAZolam (XANAX) 1 MG tablet Take 1 mg by mouth 2 (two) times daily. scheduled     Aspirin-Acetaminophen-Caffeine (GOODY HEADACHE PO) Take 1 packet by mouth daily as needed. For pain     cyclobenzaprine  (FLEXERIL) 10 MG tablet Take 10 mg by mouth 3 (three) times daily as needed. For muscle spasms     oxyCODONE (OXYCONTIN) 40 MG 12 hr tablet Take 40 mg by mouth 3 (three) times daily as needed. For pain     PARoxetine (PAXIL) 40 MG tablet Take 40 mg by mouth every morning. Patient was taking Paroxetine 20mg  daily for the first week (2 weeks ago) and has been taking Paroxetine 40mg  daily since then.        Disposition: home   Final Dx: PLIF L4-5, L5-S1  Discharge Orders    Future Orders Please Complete By Expires   Diet - low sodium heart healthy      Increase activity slowly      Driving Restrictions      Comments:   2 weeks   Lifting restrictions      Comments:   Less than 10 lbs   Call MD for:  temperature >100.4      Call MD for:  persistant nausea and vomiting      Call MD for:  severe uncontrolled pain      Call MD for:  redness, tenderness, or signs of infection (pain, swelling, redness, odor or green/yellow discharge around incision site)         Follow-up Information    Follow up with Octavie Westerhold S in 2 weeks.   Contact information:   [address] [place] [address] [phone]           Signed: Tia Alert 07/25/2011, 8:28 AM

## 2011-09-07 ENCOUNTER — Ambulatory Visit
Admission: RE | Admit: 2011-09-07 | Discharge: 2011-09-07 | Disposition: A | Discharge status: Home / Self Care | Payer: 59 | Source: Ambulatory Visit | Attending: Neurological Surgery | Admitting: Neurological Surgery

## 2011-09-07 ENCOUNTER — Other Ambulatory Visit: Payer: Self-pay | Admitting: Neurological Surgery

## 2011-09-07 DIAGNOSIS — M47817 Spondylosis without myelopathy or radiculopathy, lumbosacral region: Secondary | ICD-10-CM

## 2011-09-07 DIAGNOSIS — M48061 Spinal stenosis, lumbar region without neurogenic claudication: Secondary | ICD-10-CM

## 2011-11-08 ENCOUNTER — Other Ambulatory Visit: Payer: Self-pay | Admitting: Neurological Surgery

## 2011-11-08 ENCOUNTER — Ambulatory Visit
Admission: RE | Admit: 2011-11-08 | Discharge: 2011-11-08 | Disposition: A | Discharge status: Home / Self Care | Payer: 59 | Source: Ambulatory Visit | Attending: Neurological Surgery | Admitting: Neurological Surgery

## 2011-11-08 DIAGNOSIS — M47817 Spondylosis without myelopathy or radiculopathy, lumbosacral region: Secondary | ICD-10-CM

## 2012-01-26 ENCOUNTER — Encounter (HOSPITAL_COMMUNITY): Payer: Self-pay | Admitting: Physical Medicine and Rehabilitation

## 2012-01-26 ENCOUNTER — Emergency Department (HOSPITAL_COMMUNITY)
Admission: EM | Admit: 2012-01-26 | Discharge: 2012-01-26 | Disposition: A | Discharge status: Home / Self Care | Payer: Self-pay | Attending: Emergency Medicine | Admitting: Emergency Medicine

## 2012-01-26 DIAGNOSIS — S61209A Unspecified open wound of unspecified finger without damage to nail, initial encounter: Secondary | ICD-10-CM | POA: Insufficient documentation

## 2012-01-26 DIAGNOSIS — F411 Generalized anxiety disorder: Secondary | ICD-10-CM | POA: Insufficient documentation

## 2012-01-26 DIAGNOSIS — Z79899 Other long term (current) drug therapy: Secondary | ICD-10-CM | POA: Insufficient documentation

## 2012-01-26 DIAGNOSIS — S61012A Laceration without foreign body of left thumb without damage to nail, initial encounter: Secondary | ICD-10-CM

## 2012-01-26 DIAGNOSIS — F17200 Nicotine dependence, unspecified, uncomplicated: Secondary | ICD-10-CM | POA: Insufficient documentation

## 2012-01-26 DIAGNOSIS — W261XXA Contact with sword or dagger, initial encounter: Secondary | ICD-10-CM | POA: Insufficient documentation

## 2012-01-26 DIAGNOSIS — W260XXA Contact with knife, initial encounter: Secondary | ICD-10-CM | POA: Insufficient documentation

## 2012-01-26 MED ORDER — HYDROCODONE-ACETAMINOPHEN 5-325 MG PO TABS: 1.0000 | ORAL_TABLET | ORAL | Status: AC | PRN

## 2012-01-26 NOTE — Discharge Instructions (Signed)

## 2012-01-26 NOTE — ED Provider Notes (Signed)
Medical screening examination/treatment/procedure(s) were performed by non-physician practitioner and as supervising physician I was immediately available for consultation/collaboration.   Gwyneth Sprout, MD 01/26/12 2312

## 2012-01-26 NOTE — ED Notes (Signed)
Pt presents to department for evaluation of L thumb laceration. States he was sharpening his pocket knife tonight when it slipped and cut his thumb. Sensation intact, able to move thumb. Capillary refill less than 2 seconds. Wound covered with pressure dressing upon arrival. Tetanus 2011. He is alert and oriented x4. 8/10 pain.

## 2012-01-26 NOTE — Progress Notes (Signed)
Orthopedic Tech Progress Note Patient Details:  [name] 1975-12-31 [id number]  Ortho Devices Type of Ortho Device: Finger splint Ortho Device/Splint Location: Left digit 1 Ortho Device/Splint Interventions: Application   Asia R Thompson 01/26/2012, 9:02 PM
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
## 2012-06-25 ENCOUNTER — Encounter (HOSPITAL_COMMUNITY): Payer: Self-pay | Admitting: *Deleted

## 2012-06-25 ENCOUNTER — Emergency Department (HOSPITAL_COMMUNITY)
Admission: EM | Admit: 2012-06-25 | Discharge: 2012-06-25 | Disposition: A | Discharge status: Home / Self Care | Payer: Self-pay | Attending: Emergency Medicine | Admitting: Emergency Medicine

## 2012-06-25 ENCOUNTER — Emergency Department (HOSPITAL_COMMUNITY): Payer: Self-pay

## 2012-06-25 DIAGNOSIS — Y9301 Activity, walking, marching and hiking: Secondary | ICD-10-CM | POA: Insufficient documentation

## 2012-06-25 DIAGNOSIS — M2550 Pain in unspecified joint: Secondary | ICD-10-CM | POA: Insufficient documentation

## 2012-06-25 DIAGNOSIS — F411 Generalized anxiety disorder: Secondary | ICD-10-CM | POA: Insufficient documentation

## 2012-06-25 DIAGNOSIS — S335XXA Sprain of ligaments of lumbar spine, initial encounter: Secondary | ICD-10-CM | POA: Insufficient documentation

## 2012-06-25 DIAGNOSIS — Z9889 Other specified postprocedural states: Secondary | ICD-10-CM | POA: Insufficient documentation

## 2012-06-25 DIAGNOSIS — F3289 Other specified depressive episodes: Secondary | ICD-10-CM | POA: Insufficient documentation

## 2012-06-25 DIAGNOSIS — S93409A Sprain of unspecified ligament of unspecified ankle, initial encounter: Secondary | ICD-10-CM | POA: Insufficient documentation

## 2012-06-25 DIAGNOSIS — F329 Major depressive disorder, single episode, unspecified: Secondary | ICD-10-CM | POA: Insufficient documentation

## 2012-06-25 DIAGNOSIS — F319 Bipolar disorder, unspecified: Secondary | ICD-10-CM | POA: Insufficient documentation

## 2012-06-25 DIAGNOSIS — W19XXXA Unspecified fall, initial encounter: Secondary | ICD-10-CM

## 2012-06-25 DIAGNOSIS — Y929 Unspecified place or not applicable: Secondary | ICD-10-CM | POA: Insufficient documentation

## 2012-06-25 DIAGNOSIS — S93402A Sprain of unspecified ligament of left ankle, initial encounter: Secondary | ICD-10-CM

## 2012-06-25 DIAGNOSIS — X500XXA Overexertion from strenuous movement or load, initial encounter: Secondary | ICD-10-CM | POA: Insufficient documentation

## 2012-06-25 DIAGNOSIS — F17200 Nicotine dependence, unspecified, uncomplicated: Secondary | ICD-10-CM | POA: Insufficient documentation

## 2012-06-25 DIAGNOSIS — S39012A Strain of muscle, fascia and tendon of lower back, initial encounter: Secondary | ICD-10-CM

## 2012-06-25 DIAGNOSIS — Z8669 Personal history of other diseases of the nervous system and sense organs: Secondary | ICD-10-CM | POA: Insufficient documentation

## 2012-06-25 DIAGNOSIS — Z8719 Personal history of other diseases of the digestive system: Secondary | ICD-10-CM | POA: Insufficient documentation

## 2012-06-25 DIAGNOSIS — M549 Dorsalgia, unspecified: Secondary | ICD-10-CM | POA: Insufficient documentation

## 2012-06-25 DIAGNOSIS — G8929 Other chronic pain: Secondary | ICD-10-CM | POA: Insufficient documentation

## 2012-06-25 MED ORDER — HYDROCODONE-ACETAMINOPHEN 5-500 MG PO TABS: 1.0000 | ORAL_TABLET | Freq: Four times a day (QID) | ORAL | Status: DC | PRN

## 2012-06-25 MED ORDER — HYDROCODONE-ACETAMINOPHEN 5-325 MG PO TABS
2.0000 | ORAL_TABLET | Freq: Once | ORAL | Status: AC
  Administered 2012-06-25: 2 via ORAL
  Filled 2012-06-25: qty 2

## 2012-06-25 NOTE — ED Notes (Signed)
Patient states he fell today while outside.  He is having left foot pain.  He is also complaining of back pain.  Patient has hx of back surgery.  Patient ambulated into triage with a limp
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
## 2012-07-03 ENCOUNTER — Encounter: Payer: Self-pay | Admitting: Medical

## 2012-07-03 ENCOUNTER — Ambulatory Visit (INDEPENDENT_AMBULATORY_CARE_PROVIDER_SITE_OTHER): Payer: Self-pay | Admitting: Medical

## 2012-07-03 VITALS — BP 130/80 | HR 68 | Temp 98.2°F | Resp 16 | Ht 70.0 in | Wt 277.0 lb

## 2012-07-03 DIAGNOSIS — F341 Dysthymic disorder: Secondary | ICD-10-CM

## 2012-07-03 DIAGNOSIS — M549 Dorsalgia, unspecified: Secondary | ICD-10-CM

## 2012-07-03 DIAGNOSIS — F319 Bipolar disorder, unspecified: Secondary | ICD-10-CM

## 2012-07-03 DIAGNOSIS — F418 Other specified anxiety disorders: Secondary | ICD-10-CM

## 2012-07-03 DIAGNOSIS — G8929 Other chronic pain: Secondary | ICD-10-CM

## 2012-07-03 MED ORDER — HYDROXYZINE HCL 25 MG PO TABS: 25.0000 mg | ORAL_TABLET | Freq: Three times a day (TID) | ORAL | Status: DC | PRN

## 2012-07-03 MED ORDER — PAROXETINE HCL 40 MG PO TABS: 40.0000 mg | ORAL_TABLET | ORAL | Status: DC

## 2012-07-03 MED ORDER — PAROXETINE HCL 40 MG PO TABS: 40.0000 mg | ORAL_TABLET | Freq: Two times a day (BID) | ORAL | Status: DC
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
## 2020-05-31 ENCOUNTER — Other Ambulatory Visit: Payer: Self-pay

## 2020-05-31 ENCOUNTER — Other Ambulatory Visit: Payer: Medicaid Other

## 2020-05-31 DIAGNOSIS — Z20822 Contact with and (suspected) exposure to covid-19: Secondary | ICD-10-CM

## 2020-05-31 NOTE — Progress Notes (Signed)
la 

## 2020-06-01 LAB — SARS-COV-2, NAA 2 DAY TAT

## 2020-06-01 LAB — NOVEL CORONAVIRUS, NAA: SARS-CoV-2, NAA: NOT DETECTED

## 2020-06-01 LAB — SPECIMEN STATUS REPORT

## 2020-06-07 ENCOUNTER — Other Ambulatory Visit: Payer: Medicaid Other

## 2020-06-07 DIAGNOSIS — Z20822 Contact with and (suspected) exposure to covid-19: Secondary | ICD-10-CM

## 2020-06-08 LAB — SARS-COV-2, NAA 2 DAY TAT

## 2020-06-08 LAB — NOVEL CORONAVIRUS, NAA: SARS-CoV-2, NAA: NOT DETECTED

## 2020-09-24 ENCOUNTER — Ambulatory Visit (HOSPITAL_COMMUNITY)
Admission: EM | Admit: 2020-09-24 | Discharge: 2020-09-24 | Disposition: A | Discharge status: Home / Self Care | Payer: Self-pay | Attending: Family Medicine | Admitting: Family Medicine

## 2020-09-24 ENCOUNTER — Emergency Department (HOSPITAL_COMMUNITY): Payer: Self-pay

## 2020-09-24 ENCOUNTER — Encounter (HOSPITAL_COMMUNITY): Payer: Self-pay | Admitting: Cardiology

## 2020-09-24 ENCOUNTER — Observation Stay (HOSPITAL_COMMUNITY)
Admission: EM | Admit: 2020-09-24 | Discharge: 2020-09-25 | Disposition: A | Discharge status: Home / Self Care | Payer: Self-pay | Attending: Cardiology | Admitting: Cardiology

## 2020-09-24 ENCOUNTER — Encounter (HOSPITAL_COMMUNITY): Admission: EM | Disposition: A | Discharge status: Home / Self Care | Payer: Self-pay | Source: Home / Self Care

## 2020-09-24 ENCOUNTER — Other Ambulatory Visit: Payer: Self-pay

## 2020-09-24 ENCOUNTER — Encounter (HOSPITAL_COMMUNITY): Payer: Self-pay

## 2020-09-24 DIAGNOSIS — Q2381 Bicuspid aortic valve: Secondary | ICD-10-CM

## 2020-09-24 DIAGNOSIS — I249 Acute ischemic heart disease, unspecified: Secondary | ICD-10-CM

## 2020-09-24 DIAGNOSIS — E785 Hyperlipidemia, unspecified: Secondary | ICD-10-CM | POA: Insufficient documentation

## 2020-09-24 DIAGNOSIS — I351 Nonrheumatic aortic (valve) insufficiency: Secondary | ICD-10-CM

## 2020-09-24 DIAGNOSIS — R079 Chest pain, unspecified: Secondary | ICD-10-CM

## 2020-09-24 DIAGNOSIS — I200 Unstable angina: Secondary | ICD-10-CM | POA: Diagnosis present

## 2020-09-24 DIAGNOSIS — Q231 Congenital insufficiency of aortic valve: Secondary | ICD-10-CM

## 2020-09-24 DIAGNOSIS — I25111 Atherosclerotic heart disease of native coronary artery with angina pectoris with documented spasm: Principal | ICD-10-CM | POA: Insufficient documentation

## 2020-09-24 DIAGNOSIS — Z720 Tobacco use: Secondary | ICD-10-CM

## 2020-09-24 DIAGNOSIS — I201 Angina pectoris with documented spasm: Secondary | ICD-10-CM | POA: Diagnosis present

## 2020-09-24 DIAGNOSIS — R931 Abnormal findings on diagnostic imaging of heart and coronary circulation: Secondary | ICD-10-CM | POA: Insufficient documentation

## 2020-09-24 DIAGNOSIS — I352 Nonrheumatic aortic (valve) stenosis with insufficiency: Secondary | ICD-10-CM | POA: Insufficient documentation

## 2020-09-24 DIAGNOSIS — F17210 Nicotine dependence, cigarettes, uncomplicated: Secondary | ICD-10-CM | POA: Insufficient documentation

## 2020-09-24 DIAGNOSIS — Z20822 Contact with and (suspected) exposure to covid-19: Secondary | ICD-10-CM | POA: Insufficient documentation

## 2020-09-24 DIAGNOSIS — R9431 Abnormal electrocardiogram [ECG] [EKG]: Secondary | ICD-10-CM

## 2020-09-24 HISTORY — PX: LEFT HEART CATH AND CORONARY ANGIOGRAPHY: CATH118249

## 2020-09-24 HISTORY — DX: Tobacco use: Z72.0

## 2020-09-24 HISTORY — PX: AORTIC ARCH ANGIOGRAPHY: CATH118224

## 2020-09-24 HISTORY — DX: Angina pectoris with documented spasm: I20.1

## 2020-09-24 LAB — I-STAT CHEM 8, ED
BUN: 18 mg/dL (ref 6–20)
Calcium, Ion: 1.19 mmol/L (ref 1.15–1.40)
Chloride: 100 mmol/L (ref 98–111)
Creatinine, Ser: 0.7 mg/dL (ref 0.61–1.24)
Glucose, Bld: 99 mg/dL (ref 70–99)
HCT: 45 % (ref 39.0–52.0)
Hemoglobin: 15.3 g/dL (ref 13.0–17.0)
Potassium: 4.3 mmol/L (ref 3.5–5.1)
Sodium: 139 mmol/L (ref 135–145)
TCO2: 27 mmol/L (ref 22–32)

## 2020-09-24 LAB — CBC WITH DIFFERENTIAL/PLATELET
Abs Immature Granulocytes: 0.04 10*3/uL (ref 0.00–0.07)
Basophils Absolute: 0 10*3/uL (ref 0.0–0.1)
Basophils Relative: 0 %
Eosinophils Absolute: 0.3 10*3/uL (ref 0.0–0.5)
Eosinophils Relative: 3 %
HCT: 45.9 % (ref 39.0–52.0)
Hemoglobin: 15.2 g/dL (ref 13.0–17.0)
Immature Granulocytes: 0 %
Lymphocytes Relative: 28 %
Lymphs Abs: 2.7 10*3/uL (ref 0.7–4.0)
MCH: 29 pg (ref 26.0–34.0)
MCHC: 33.1 g/dL (ref 30.0–36.0)
MCV: 87.6 fL (ref 80.0–100.0)
Monocytes Absolute: 0.5 10*3/uL (ref 0.1–1.0)
Monocytes Relative: 5 %
Neutro Abs: 5.8 10*3/uL (ref 1.7–7.7)
Neutrophils Relative %: 64 %
Platelets: 180 10*3/uL (ref 150–400)
RBC: 5.24 MIL/uL (ref 4.22–5.81)
RDW: 13.2 % (ref 11.5–15.5)
WBC: 9.4 10*3/uL (ref 4.0–10.5)
nRBC: 0 % (ref 0.0–0.2)

## 2020-09-24 LAB — HEMOGLOBIN A1C
Hgb A1c MFr Bld: 5.8 % — ABNORMAL HIGH (ref 4.8–5.6)
Mean Plasma Glucose: 119.76 mg/dL

## 2020-09-24 LAB — COMPREHENSIVE METABOLIC PANEL
ALT: 18 U/L (ref 0–44)
AST: 20 U/L (ref 15–41)
Albumin: 3.7 g/dL (ref 3.5–5.0)
Alkaline Phosphatase: 52 U/L (ref 38–126)
Anion gap: 10 (ref 5–15)
BUN: 15 mg/dL (ref 6–20)
CO2: 28 mmol/L (ref 22–32)
Calcium: 9 mg/dL (ref 8.9–10.3)
Chloride: 100 mmol/L (ref 98–111)
Creatinine, Ser: 0.73 mg/dL (ref 0.61–1.24)
GFR, Estimated: >60 mL/min (ref >60)
Glucose, Bld: 101 mg/dL — ABNORMAL HIGH (ref 70–99)
Potassium: 4.3 mmol/L (ref 3.5–5.1)
Sodium: 138 mmol/L (ref 135–145)
Total Bilirubin: 0.6 mg/dL (ref 0.3–1.2)
Total Protein: 6.2 g/dL — ABNORMAL LOW (ref 6.5–8.1)

## 2020-09-24 LAB — LIPID PANEL
Cholesterol: 175 mg/dL (ref 0–200)
HDL: 44 mg/dL (ref >40)
LDL Cholesterol: 111 mg/dL — ABNORMAL HIGH (ref 0–99)
Total CHOL/HDL Ratio: 4 RATIO
Triglycerides: 101 mg/dL (ref <150)
VLDL: 20 mg/dL (ref 0–40)

## 2020-09-24 LAB — PROTIME-INR
INR: 1 (ref 0.8–1.2)
Prothrombin Time: 12.8 seconds (ref 11.4–15.2)

## 2020-09-24 LAB — APTT: aPTT: 29 seconds (ref 24–36)

## 2020-09-24 LAB — TROPONIN I (HIGH SENSITIVITY)
Troponin I (High Sensitivity): 4 ng/L (ref <18)
Troponin I (High Sensitivity): 5 ng/L (ref <18)

## 2020-09-24 LAB — RESP PANEL BY RT-PCR (FLU A&B, COVID) ARPGX2
Influenza A by PCR: NEGATIVE
Influenza B by PCR: NEGATIVE
SARS Coronavirus 2 by RT PCR: NEGATIVE

## 2020-09-24 SURGERY — LEFT HEART CATH AND CORONARY ANGIOGRAPHY
Anesthesia: LOCAL

## 2020-09-24 MED ORDER — IOHEXOL 350 MG/ML SOLN
INTRAVENOUS | Status: DC | PRN
  Administered 2020-09-24: 130 mL via INTRA_ARTERIAL

## 2020-09-24 MED ORDER — FENTANYL CITRATE (PF) 100 MCG/2ML IJ SOLN
INTRAMUSCULAR | Status: AC
  Filled 2020-09-24: qty 2

## 2020-09-24 MED ORDER — ONDANSETRON HCL 4 MG/2ML IJ SOLN: 4.0000 mg | Freq: Four times a day (QID) | INTRAMUSCULAR | Status: DC | PRN

## 2020-09-24 MED ORDER — LIDOCAINE HCL (PF) 1 % IJ SOLN
INTRAMUSCULAR | Status: AC
  Filled 2020-09-24: qty 30

## 2020-09-24 MED ORDER — VERAPAMIL HCL 2.5 MG/ML IV SOLN
INTRAVENOUS | Status: DC | PRN
  Administered 2020-09-24: 10 mL via INTRA_ARTERIAL

## 2020-09-24 MED ORDER — PAROXETINE HCL 20 MG PO TABS
40.0000 mg | ORAL_TABLET | Freq: Every day | ORAL | Status: DC
  Administered 2020-09-24 – 2020-09-25 (×2): 40 mg via ORAL
  Filled 2020-09-24 (×2): qty 2

## 2020-09-24 MED ORDER — NITROGLYCERIN 0.4 MG SL SUBL: 0.4000 mg | SUBLINGUAL_TABLET | SUBLINGUAL | Status: DC | PRN

## 2020-09-24 MED ORDER — LIDOCAINE HCL (PF) 1 % IJ SOLN
INTRAMUSCULAR | Status: DC | PRN
  Administered 2020-09-24: 2 mL via SUBCUTANEOUS

## 2020-09-24 MED ORDER — SODIUM CHLORIDE 0.9 % IV SOLN
INTRAVENOUS | Status: AC | PRN
Start: 2020-09-24 — End: 2020-09-24
  Administered 2020-09-24: 100 mL/h via INTRAVENOUS

## 2020-09-24 MED ORDER — IOHEXOL 350 MG/ML SOLN
75.0000 mL | Freq: Once | INTRAVENOUS | Status: AC | PRN
  Administered 2020-09-24: 75 mL via INTRAVENOUS

## 2020-09-24 MED ORDER — HEPARIN (PORCINE) IN NACL 1000-0.9 UT/500ML-% IV SOLN
INTRAVENOUS | Status: DC | PRN
  Administered 2020-09-24 (×2): 500 mL

## 2020-09-24 MED ORDER — ASPIRIN 81 MG PO CHEW
81.0000 mg | CHEWABLE_TABLET | Freq: Every day | ORAL | Status: DC
  Administered 2020-09-25: 81 mg via ORAL
  Filled 2020-09-24: qty 1

## 2020-09-24 MED ORDER — SODIUM CHLORIDE 0.9% FLUSH
3.0000 mL | Freq: Two times a day (BID) | INTRAVENOUS | Status: DC
  Administered 2020-09-25: 3 mL via INTRAVENOUS

## 2020-09-24 MED ORDER — NITROGLYCERIN 0.4 MG SL SUBL
SUBLINGUAL_TABLET | SUBLINGUAL | Status: AC
  Filled 2020-09-24: qty 1

## 2020-09-24 MED ORDER — ASPIRIN 81 MG PO CHEW
CHEWABLE_TABLET | ORAL | Status: AC
  Filled 2020-09-24: qty 4

## 2020-09-24 MED ORDER — SODIUM CHLORIDE 0.9 % IV SOLN: 250.0000 mL | INTRAVENOUS | Status: DC | PRN

## 2020-09-24 MED ORDER — HYDRALAZINE HCL 20 MG/ML IJ SOLN: 10.0000 mg | INTRAMUSCULAR | Status: AC | PRN

## 2020-09-24 MED ORDER — NITROGLYCERIN 1 MG/10 ML FOR IR/CATH LAB
INTRA_ARTERIAL | Status: AC
  Filled 2020-09-24: qty 10

## 2020-09-24 MED ORDER — MIDAZOLAM HCL 2 MG/2ML IJ SOLN
INTRAMUSCULAR | Status: AC
  Filled 2020-09-24: qty 2

## 2020-09-24 MED ORDER — HYDROCODONE-ACETAMINOPHEN 10-325 MG PO TABS: 1.0000 | ORAL_TABLET | Freq: Four times a day (QID) | ORAL | Status: DC | PRN

## 2020-09-24 MED ORDER — SODIUM CHLORIDE 0.9 % WEIGHT BASED INFUSION: 1.0000 mL/kg/h | INTRAVENOUS | Status: AC

## 2020-09-24 MED ORDER — SODIUM CHLORIDE 0.9% FLUSH: 3.0000 mL | INTRAVENOUS | Status: DC | PRN

## 2020-09-24 MED ORDER — HEPARIN SODIUM (PORCINE) 1000 UNIT/ML IJ SOLN
INTRAMUSCULAR | Status: DC | PRN
  Administered 2020-09-24: 5000 [IU] via INTRAVENOUS

## 2020-09-24 MED ORDER — HEPARIN (PORCINE) IN NACL 1000-0.9 UT/500ML-% IV SOLN
INTRAVENOUS | Status: AC
  Filled 2020-09-24: qty 1000

## 2020-09-24 MED ORDER — ATORVASTATIN CALCIUM 80 MG PO TABS
80.0000 mg | ORAL_TABLET | Freq: Every day | ORAL | Status: DC
  Administered 2020-09-24 – 2020-09-25 (×2): 80 mg via ORAL
  Filled 2020-09-24 (×2): qty 1

## 2020-09-24 MED ORDER — FENTANYL CITRATE (PF) 100 MCG/2ML IJ SOLN
INTRAMUSCULAR | Status: DC | PRN
  Administered 2020-09-24: 25 ug via INTRAVENOUS

## 2020-09-24 MED ORDER — DIAZEPAM 5 MG PO TABS
10.0000 mg | ORAL_TABLET | ORAL | Status: DC | PRN
  Administered 2020-09-25: 10 mg via ORAL
  Filled 2020-09-24: qty 2

## 2020-09-24 MED ORDER — MIDAZOLAM HCL 2 MG/2ML IJ SOLN
INTRAMUSCULAR | Status: DC | PRN
  Administered 2020-09-24: 2 mg via INTRAVENOUS

## 2020-09-24 MED ORDER — IOHEXOL 350 MG/ML SOLN
INTRAVENOUS | Status: AC
  Filled 2020-09-24: qty 1

## 2020-09-24 MED ORDER — AMLODIPINE BESYLATE 5 MG PO TABS
5.0000 mg | ORAL_TABLET | Freq: Every day | ORAL | Status: DC
  Administered 2020-09-24 – 2020-09-25 (×2): 5 mg via ORAL
  Filled 2020-09-24 (×2): qty 1

## 2020-09-24 MED ORDER — ACETAMINOPHEN 325 MG PO TABS: 650.0000 mg | ORAL_TABLET | ORAL | Status: DC | PRN

## 2020-09-24 MED ORDER — ASPIRIN 81 MG PO CHEW
324.0000 mg | CHEWABLE_TABLET | Freq: Once | ORAL | Status: AC
  Administered 2020-09-24: 324 mg via ORAL

## 2020-09-24 MED ORDER — SODIUM CHLORIDE 0.9% FLUSH
3.0000 mL | Freq: Two times a day (BID) | INTRAVENOUS | Status: DC
  Administered 2020-09-25 (×2): 3 mL via INTRAVENOUS

## 2020-09-24 MED ORDER — LABETALOL HCL 5 MG/ML IV SOLN: 10.0000 mg | INTRAVENOUS | Status: AC | PRN

## 2020-09-24 SURGICAL SUPPLY — 13 items
CATH INFINITI 5 FR JL3.5 (CATHETERS) ×1 IMPLANT
CATH INFINITI 5FR ANG PIGTAIL (CATHETERS) ×1 IMPLANT
CATH OPTITORQUE TIG 4.0 5F (CATHETERS) ×1 IMPLANT
DEVICE RAD COMP TR BAND LRG (VASCULAR PRODUCTS) ×1 IMPLANT
ELECT DEFIB PAD ADLT CADENCE (PAD) ×1 IMPLANT
GLIDESHEATH SLEND A-KIT 6F 22G (SHEATH) ×1 IMPLANT
GUIDEWIRE INQWIRE 1.5J.035X260 (WIRE) IMPLANT
INQWIRE 1.5J .035X260CM (WIRE) ×2
KIT HEART LEFT (KITS) ×2 IMPLANT
PACK CARDIAC CATHETERIZATION (CUSTOM PROCEDURE TRAY) ×2 IMPLANT
SYR MEDRAD MARK 7 150ML (SYRINGE) ×1 IMPLANT
TRANSDUCER W/STOPCOCK (MISCELLANEOUS) ×2 IMPLANT
TUBING CIL FLEX 10 FLL-RA (TUBING) ×2 IMPLANT

## 2020-09-24 NOTE — ED Triage Notes (Signed)
EMS with Pt on their stretcher for transport to [facility]
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
## 2020-09-24 NOTE — ED Notes (Signed)
Notified [facility] 

## 2020-09-24 NOTE — Progress Notes (Signed)
Pt taken to CT Scan with RN and CT Tech present, then to 6E-19 by stretcher, report called from CT scan to floor RN, made aware covid test still pending, TR band in place to right wrist with 9cc of air noted, safety maintained

## 2020-09-24 NOTE — ED Triage Notes (Signed)
Pt presents with chest pain and SOB. Pt states his left upper shoulder hurts. He states he feels something is on top of his chest. Pt states he has been dizzy and denies numbness on left arm. He states he feels pain from the elbow to his forearm.
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
## 2020-09-24 NOTE — ED Triage Notes (Signed)
Arrived via EMS from Urgent Care. Onset of chest pressure yesterday continued today 2/10 pressure. Urgent care administered Aspirin 324 mg. Upon arrival pain 0/10.

## 2020-09-24 NOTE — ED Notes (Signed)
Reported chief complaint and EKG to Clent Jacks, [place].

## 2020-09-25 ENCOUNTER — Other Ambulatory Visit: Payer: Self-pay | Admitting: Cardiology

## 2020-09-25 ENCOUNTER — Other Ambulatory Visit (HOSPITAL_COMMUNITY): Payer: Medicaid Other

## 2020-09-25 ENCOUNTER — Observation Stay (HOSPITAL_COMMUNITY)
Admission: RE | Admit: 2020-09-25 | Discharge: 2020-09-25 | Disposition: A | Discharge status: Home / Self Care | Payer: Self-pay | Source: Ambulatory Visit | Attending: Cardiology | Admitting: Cardiology

## 2020-09-25 ENCOUNTER — Other Ambulatory Visit (HOSPITAL_COMMUNITY): Payer: Self-pay | Admitting: Cardiology

## 2020-09-25 ENCOUNTER — Encounter (HOSPITAL_COMMUNITY): Payer: Self-pay | Admitting: Cardiology

## 2020-09-25 DIAGNOSIS — I359 Nonrheumatic aortic valve disorder, unspecified: Secondary | ICD-10-CM

## 2020-09-25 LAB — ECHOCARDIOGRAM COMPLETE
AR max vel: 1.37 cm2
AV Area VTI: 1.29 cm2
AV Area mean vel: 1.35 cm2
AV Mean grad: 31 mmHg
AV Peak grad: 48.2 mmHg
Ao pk vel: 3.47 m/s
Area-P 1/2: 2.39 cm2
P 1/2 time: 770 msec
S' Lateral: 2.7 cm
Weight: 3425.6 oz

## 2020-09-25 MED ORDER — NITROGLYCERIN 0.4 MG SL SUBL: 0.4000 mg | SUBLINGUAL_TABLET | SUBLINGUAL | 3 refills | Status: DC | PRN

## 2020-09-25 MED ORDER — AMLODIPINE BESYLATE 5 MG PO TABS: 5.0000 mg | ORAL_TABLET | Freq: Every day | ORAL | 2 refills | Status: DC

## 2020-09-25 MED ORDER — ATORVASTATIN CALCIUM 10 MG PO TABS: 80.0000 mg | ORAL_TABLET | Freq: Every day | ORAL | 2 refills | Status: DC

## 2020-09-25 MED ORDER — ASPIRIN EC 81 MG PO TBEC: 81.0000 mg | DELAYED_RELEASE_TABLET | Freq: Every day | ORAL | 3 refills | Status: DC

## 2020-09-25 MED FILL — AMLODIPINE BESYLATE 5 MG TA: 5 | 30 days supply | Qty: 30 | Fill #0

## 2020-09-25 MED FILL — NITROGLYCERIN 0.4 MG TAB SL: 0.4 | 25 days supply | Qty: 25 | Fill #0

## 2020-09-25 MED FILL — ATORVASTATIN CALCIUM 80 MG: 80 | 30 days supply | Qty: 30 | Fill #0

## 2020-09-25 NOTE — Plan of Care (Signed)
  Problem: Education: Goal: Knowledge of General Education information will improve Description: Including pain rating scale, medication(s)/side effects and non-pharmacologic comfort measures Outcome: Adequate for Discharge   

## 2020-09-25 NOTE — Progress Notes (Signed)
Patient given discharge instructions and is waiting for his TOC medication. Patient stated understanding of his instructions and didn't have any questions.  He is going to his ECHO outpatient appointment at [facility] today.

## 2020-09-25 NOTE — Discharge Summary (Signed)
Physician Discharge Summary  Patient ID: HEIDI LEMAY MRN: [id number] DOB/AGE: 08-Jul-1976 45 y.o. Jac Canavan, PA-C   Admit date: 09/24/2020 Discharge date: 09/25/2020  Primary Discharge Diagnosis 1.  Coronary vasospasm with transient ST segment elevation in anterior leads and T abnormalities in the inferior leads with no significant coronary disease by angiography, except for a very focal 20% left main disease, normal coronary arteries. 2.  Mild hyperlipidemia 3.  Moderate aortic stenosis and mild aortic regurgitation, most probably bicuspid aortic valve. 4.  Tobacco use disorder   Significant Diagnostic Studies:  Left Heart Catheterization 09/24/20:  LV: Normal LV systolic function, normal LVEDP.  Peak gradient of 23 mmHg across the aortic valve. Ascending aortogram: The aortic root is minimally dilated.  There is mild to moderate amount of calcification of the aortic valve.  The aortic valve appears to be bicuspid.  There is moderate aortic regurgitation.  There is mild aortic stenosis with a peak gradient of 23 mmHg.  No obvious dissection. Left main: Mild eccentric 20% stenosis. LAD: Smooth and normal, mildly tortuous. CX: Normal. RCA: Normal.   Echocardiogram 09/08/2020:   1. Left ventricular ejection fraction, by estimation, is 55 to 60%. Left ventricular ejection fraction by PLAX is 59 %. The left ventricle has normal function. The left ventricle has no regional wall motion abnormalities. There is moderate left ventricular hypertrophy. Left ventricular diastolic parameters are consistent with Grade II diastolic dysfunction (pseudonormalization). Elevated left ventricular end-diastolic pressure. 2. Right ventricular systolic function is normal. The right ventricular size is normal. 3. Left atrial size was moderately dilated. 4. The mitral valve is normal in structure. Trivial mitral valve regurgitation. 5. Probably bicuspid AV with fused right and non-coronary  cusps.. The aortic valve was not well visualized. There is moderate calcification of the aortic valve. There is severe thickening of the aortic valve. Aortic valve regurgitation is mild. Moderate aortic valve stenosis. Aortic valve area, by VTI measures 1.29 cm. Aortic valve mean gradient measures 31.0 mmHg. Aortic valve Vmax measures 3.47 m/s. 6. Aortic Mild calcification of the aortic root. There is mild (Grade II) plaque involving the aortic root. 7. The inferior vena cava is normal in size with greater than 50% respiratory variability, suggesting right atrial pressure of 3 mmHg.   Radiology:  CT ANGIO CHEST AORTA W/CM & OR WO/CM Result Date: 09/24/2020  FINDINGS: Cardiovascular: Preferential opacification of the thoracic aorta. No evidence of thoracic aortic aneurysm or dissection. Normal heart size. No pericardial effusion.   Mediastinum/Nodes: No enlarged mediastinal, hilar, or axillary lymph nodes. Thyroid gland, trachea, and esophagus demonstrate no significant findings.   Lungs/Pleura: No pleural effusion. Calcified granuloma noted in the left upper lobe. No airspace consolidation, atelectasis or pneumothorax. Upper Abdomen: No acute abnormality.  Musculoskeletal: Degenerative disc disease noted within the lower thoracic spine. No acute or suspicious osseous findings. Review of the MIP images confirms the above findings.   IMPRESSION: 1. No acute cardiopulmonary abnormalities. No evidence for thoracic aortic aneurysm or dissection. 2. Previous granulomatous disease.   Hospital Course: KRRISH FREUND is a 45 y.o. male Caucasian male with no significant prior cardiovascular history, has been having frequent episodes of chest tightness in the middle of the chest with radiation to his arms and also to his back, started around 3 PM yesterday.  It was intense yesterday, kept having on and off episodes, but eventually presented to the emergency room where he was found to have markedly  abnormal EKG suggestive of ACS, although EKG did  not meet STEMI criteria, there was suggestion of ST elevation in the anterior leads and subendocardial infarct in the inferior leads.  In view of ongoing chest pain, he was emergently transferred to the cardiac catheterization lab for evaluation of coronary anatomy.  Cardiac catheterization on emergent basis only revealed normal coronary arteries except for a mild calcific eccentric left main disease of 20% and possibly bicuspid aortic valve and moderate aortic stenosis.  He also had moderate to severe aortic regurgitation by angiography.  He had totally normalized his EKG upon arrival to the hospital Cath Lab, remained asymptomatic during hospitalization.  Cardiac markers were also negative for myocardial injury.  CT angiography was also performed to exclude aortic aneurysm and aortic dissection.  Recommendations on discharge: Patient will be started on amlodipine for possible coronary vasospasm.  Smoking cessation has been discussed extensively with the patient.  Aspirin was not prescribed as he has no significant coronary disease.  Atorvastatin was started at 10 mg for mild hyperlipidemia.  We will see him back in the office in couple weeks.  Discharge Exam: Vitals with BMI 09/25/2020 09/24/2020 09/24/2020  Height - - -  Weight 214 lbs 2 oz - -  BMI 29.87 - -  Systolic 160 115 938  Diastolic 95 58 66  Pulse 71 75 -     Physical Exam Constitutional:      Appearance: Normal appearance.  Cardiovascular:     Rate and Rhythm: Normal rate and regular rhythm.     Pulses: Intact distal pulses.     Heart sounds: Murmur heard.   Harsh midsystolic murmur is present with a grade of 3/6 at the upper right sternal border radiating to the neck. No gallop.      Comments: No leg edema, no JVD. Pulmonary:     Effort: Pulmonary effort is normal.     Breath sounds: Normal breath sounds.  Abdominal:     General: Bowel sounds are normal.     Palpations:  Abdomen is soft.  Musculoskeletal:        General: No swelling. Normal range of motion.  Skin:    Capillary Refill: Capillary refill takes less than 2 seconds.  Neurological:     General: No focal deficit present.     Mental Status: He is alert and oriented to person, place, and time.    Labs:   Lab Results  Component Value Date   WBC 9.4 09/24/2020   HGB 15.3 09/24/2020   HCT 45.0 09/24/2020   MCV 87.6 09/24/2020   PLT 180 09/24/2020    Recent Labs  Lab 09/24/20 1257 09/24/20 1302  NA 138 139  K 4.3 4.3  CL 100 100  CO2 28  --   BUN 15 18  CREATININE 0.73 0.70  CALCIUM 9.0  --   PROT 6.2*  --   BILITOT 0.6  --   ALKPHOS 52  --   ALT 18  --   AST 20  --   GLUCOSE 101* 99    Lipid Panel     Component Value Date/Time   CHOL 175 09/24/2020 1257   TRIG 101 09/24/2020 1257   HDL 44 09/24/2020 1257   CHOLHDL 4.0 09/24/2020 1257   VLDL 20 09/24/2020 1257   LDLCALC 111 (H) 09/24/2020 1257    BNP (last 3 results) No results for input(s): BNP in the last 8760 hours.  HEMOGLOBIN A1C Lab Results  Component Value Date   HGBA1C 5.8 (H) 09/24/2020   MPG 119.76 09/24/2020  Cardiac Panel    Component Ref Range & Units 1 d ago  (09/24/20) 1 d ago  (09/24/20)  Troponin I (High Sensitivity) <18 ng/L 5  4 CM      TSH No results for input(s): TSH in the last 8760 hours.  FOLLOW UP PLANS AND APPOINTMENTS  Allergies as of 09/25/2020   No Known Allergies     Medication List    TAKE these medications   ALPRAZolam 1 MG tablet Commonly known as: XANAX Take 1 mg by mouth daily as needed for anxiety.   amLODipine 5 MG tablet Commonly known as: NORVASC Take 1 tablet (5 mg total) by mouth daily. Start taking on: September 26, 2020   aspirin EC 81 MG tablet Take 1 tablet (81 mg total) by mouth daily.   atorvastatin 10 MG tablet Commonly known as: LIPITOR Take 8 tablets (80 mg total) by mouth daily. Start taking on: September 26, 2020   nitroGLYCERIN 0.4  MG SL tablet Commonly known as: NITROSTAT Place 1 tablet (0.4 mg total) under the tongue every 5 (five) minutes as needed for up to 25 days for chest pain.   PARoxetine 40 MG tablet Commonly known as: PAXIL Take 1 tablet (40 mg total) by mouth daily. Start taking on: September 26, 2020 What changed: when to take this       Follow-up Information    Yates Decamp, MD Follow up on 10/06/2020.   Specialty: Cardiology Why: 3 PM. Bring all your medications Contact information: [address] [place] [address] [phone]                Yates Decamp, MD, [facility] 09/25/2020, 6:30 PM Office: [phone]

## 2020-09-25 NOTE — Progress Notes (Signed)
Echocardiogram 2D Echocardiogram has been performed.  [name] 09/25/2020, 3:56 PM

## 2020-10-06 ENCOUNTER — Ambulatory Visit: Payer: 59 | Admitting: Cardiology

## 2020-10-06 ENCOUNTER — Encounter: Payer: Self-pay | Admitting: Cardiology

## 2020-10-06 ENCOUNTER — Other Ambulatory Visit: Payer: Self-pay

## 2020-10-06 VITALS — BP 151/81 | HR 75 | Temp 98.4°F | Resp 16 | Ht 71.0 in | Wt 216.2 lb

## 2020-10-06 DIAGNOSIS — R030 Elevated blood-pressure reading, without diagnosis of hypertension: Secondary | ICD-10-CM

## 2020-10-06 DIAGNOSIS — E785 Hyperlipidemia, unspecified: Secondary | ICD-10-CM

## 2020-10-06 DIAGNOSIS — F17200 Nicotine dependence, unspecified, uncomplicated: Secondary | ICD-10-CM

## 2020-10-06 DIAGNOSIS — I201 Angina pectoris with documented spasm: Secondary | ICD-10-CM

## 2020-10-06 DIAGNOSIS — R0989 Other specified symptoms and signs involving the circulatory and respiratory systems: Secondary | ICD-10-CM

## 2020-10-06 DIAGNOSIS — Q231 Congenital insufficiency of aortic valve: Secondary | ICD-10-CM

## 2020-10-06 MED ORDER — NICOTINE 7 MG/24HR TD PT24: 7.0000 mg | MEDICATED_PATCH | Freq: Every day | TRANSDERMAL | 0 refills | Status: DC

## 2020-10-06 MED ORDER — BUPROPION HCL ER (SR) 150 MG PO TB12: 150.0000 mg | ORAL_TABLET | Freq: Two times a day (BID) | ORAL | 3 refills | Status: DC

## 2020-10-06 MED ORDER — NICOTINE 21 MG/24HR TD PT24: 21.0000 mg | MEDICATED_PATCH | Freq: Every day | TRANSDERMAL | 0 refills | Status: DC

## 2020-10-06 MED ORDER — NICOTINE 14 MG/24HR TD PT24: 14.0000 mg | MEDICATED_PATCH | Freq: Every day | TRANSDERMAL | 0 refills | Status: DC
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
## 2020-11-11 ENCOUNTER — Other Ambulatory Visit: Payer: Self-pay

## 2020-11-11 ENCOUNTER — Ambulatory Visit: Payer: 59

## 2020-11-11 DIAGNOSIS — R0989 Other specified symptoms and signs involving the circulatory and respiratory systems: Secondary | ICD-10-CM

## 2020-11-16 ENCOUNTER — Other Ambulatory Visit: Payer: Self-pay | Admitting: Cardiology

## 2020-11-16 DIAGNOSIS — I6523 Occlusion and stenosis of bilateral carotid arteries: Secondary | ICD-10-CM

## 2020-12-16 LAB — LIPID PANEL WITH LDL/HDL RATIO
Cholesterol, Total: 135 mg/dL (ref 100–199)
HDL: 50 mg/dL (ref >39)
LDL Chol Calc (NIH): 75 mg/dL (ref 0–99)
LDL/HDL Ratio: 1.5 ratio (ref 0.0–3.6)
Triglycerides: 42 mg/dL (ref 0–149)
VLDL Cholesterol Cal: 10 mg/dL (ref 5–40)

## 2020-12-17 ENCOUNTER — Encounter: Payer: Self-pay | Admitting: Cardiology

## 2020-12-17 ENCOUNTER — Ambulatory Visit: Payer: 59 | Admitting: Cardiology

## 2020-12-17 ENCOUNTER — Other Ambulatory Visit: Payer: Self-pay

## 2020-12-17 VITALS — BP 117/68 | HR 70 | Temp 98.6°F | Resp 17 | Ht 71.0 in | Wt 226.8 lb

## 2020-12-17 DIAGNOSIS — F411 Generalized anxiety disorder: Secondary | ICD-10-CM

## 2020-12-17 DIAGNOSIS — F17200 Nicotine dependence, unspecified, uncomplicated: Secondary | ICD-10-CM

## 2020-12-17 DIAGNOSIS — Q231 Congenital insufficiency of aortic valve: Secondary | ICD-10-CM

## 2020-12-17 DIAGNOSIS — I201 Angina pectoris with documented spasm: Secondary | ICD-10-CM

## 2020-12-17 DIAGNOSIS — E785 Hyperlipidemia, unspecified: Secondary | ICD-10-CM

## 2020-12-17 DIAGNOSIS — I6523 Occlusion and stenosis of bilateral carotid arteries: Secondary | ICD-10-CM

## 2020-12-17 MED ORDER — BUPROPION HCL ER (SR) 150 MG PO TB12: 150.0000 mg | ORAL_TABLET | Freq: Two times a day (BID) | ORAL | 1 refills | Status: DC

## 2020-12-17 MED ORDER — AMLODIPINE BESYLATE 5 MG PO TABS
5.0000 mg | ORAL_TABLET | Freq: Every day | ORAL | 3 refills | Status: DC
Start: 2020-12-17 — End: 2021-01-09

## 2020-12-17 MED ORDER — ATORVASTATIN CALCIUM 10 MG PO TABS: 10.0000 mg | ORAL_TABLET | Freq: Every day | ORAL | 3 refills | Status: DC

## 2020-12-17 MED ORDER — ALPRAZOLAM 0.5 MG PO TABS: 0.5000 mg | ORAL_TABLET | Freq: Every day | ORAL | 1 refills | Status: DC | PRN
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
## 2021-01-09 ENCOUNTER — Other Ambulatory Visit: Payer: Self-pay

## 2021-01-09 DIAGNOSIS — E785 Hyperlipidemia, unspecified: Secondary | ICD-10-CM

## 2021-01-09 MED ORDER — ATORVASTATIN CALCIUM 10 MG PO TABS: 10.0000 mg | ORAL_TABLET | Freq: Every day | ORAL | 3 refills | Status: DC

## 2021-01-09 MED ORDER — ASPIRIN EC 81 MG PO TBEC: 81.0000 mg | DELAYED_RELEASE_TABLET | Freq: Every day | ORAL | 3 refills | Status: DC

## 2021-01-09 MED ORDER — AMLODIPINE BESYLATE 5 MG PO TABS: 5.0000 mg | ORAL_TABLET | Freq: Every day | ORAL | 3 refills | Status: DC

## 2021-01-15 ENCOUNTER — Encounter (HOSPITAL_COMMUNITY): Payer: Self-pay | Admitting: *Deleted

## 2021-01-15 ENCOUNTER — Other Ambulatory Visit: Payer: Self-pay

## 2021-01-15 ENCOUNTER — Emergency Department (HOSPITAL_COMMUNITY)
Admission: EM | Admit: 2021-01-15 | Discharge: 2021-01-15 | Discharge status: Against Medical Advice | Payer: 59 | Attending: Emergency Medicine | Admitting: Emergency Medicine

## 2021-01-15 ENCOUNTER — Emergency Department (HOSPITAL_COMMUNITY): Payer: 59

## 2021-01-15 DIAGNOSIS — H938X3 Other specified disorders of ear, bilateral: Secondary | ICD-10-CM | POA: Insufficient documentation

## 2021-01-15 DIAGNOSIS — Z5321 Procedure and treatment not carried out due to patient leaving prior to being seen by health care provider: Secondary | ICD-10-CM | POA: Diagnosis not present

## 2021-01-15 LAB — BASIC METABOLIC PANEL
Anion gap: 8 (ref 5–15)
BUN: 13 mg/dL (ref 6–20)
CO2: 27 mmol/L (ref 22–32)
Calcium: 9.1 mg/dL (ref 8.9–10.3)
Chloride: 106 mmol/L (ref 98–111)
Creatinine, Ser: 0.98 mg/dL (ref 0.61–1.24)
GFR, Estimated: >60 mL/min (ref >60)
Glucose, Bld: 84 mg/dL (ref 70–99)
Potassium: 4.5 mmol/L (ref 3.5–5.1)
Sodium: 141 mmol/L (ref 135–145)

## 2021-01-15 LAB — CBC WITH DIFFERENTIAL/PLATELET
Abs Immature Granulocytes: 0.02 10*3/uL (ref 0.00–0.07)
Basophils Absolute: 0.1 10*3/uL (ref 0.0–0.1)
Basophils Relative: 1 %
Eosinophils Absolute: 0.3 10*3/uL (ref 0.0–0.5)
Eosinophils Relative: 4 %
HCT: 41.1 % (ref 39.0–52.0)
Hemoglobin: 13.7 g/dL (ref 13.0–17.0)
Immature Granulocytes: 0 %
Lymphocytes Relative: 38 %
Lymphs Abs: 2.9 10*3/uL (ref 0.7–4.0)
MCH: 29.5 pg (ref 26.0–34.0)
MCHC: 33.3 g/dL (ref 30.0–36.0)
MCV: 88.4 fL (ref 80.0–100.0)
Monocytes Absolute: 0.6 10*3/uL (ref 0.1–1.0)
Monocytes Relative: 7 %
Neutro Abs: 3.8 10*3/uL (ref 1.7–7.7)
Neutrophils Relative %: 50 %
Platelets: 193 10*3/uL (ref 150–400)
RBC: 4.65 MIL/uL (ref 4.22–5.81)
RDW: 13.4 % (ref 11.5–15.5)
WBC: 7.7 10*3/uL (ref 4.0–10.5)
nRBC: 0 % (ref 0.0–0.2)

## 2021-01-15 LAB — SALICYLATE LEVEL: Salicylate Lvl: 12.8 mg/dL (ref 7.0–30.0)

## 2021-01-15 NOTE — ED Notes (Signed)
Pt called for CT; no answer

## 2021-01-15 NOTE — ED Triage Notes (Signed)
Pt reports hearing a noise or ringing in his ears when he turns his head x 1 week. Also reports having metal taste in his mouth. No acute distress is noted at triage.

## 2021-01-15 NOTE — ED Provider Notes (Signed)
Emergency Medicine Provider Triage Evaluation Note  [name] , a 45 y.o. male  was evaluated in triage.  Patient states, "For the last 2-3 weeks, I have been having this thing where when I scan my eye one direction or the other I hear a gong and taste a metallic taste in my mouth." No new headaches, states he wakes up with a headache every morning for the last 10 years. His psychiatrist recommended he get checked out.  Review of Systems  Positive: Hear a gong and metallic taste in mouth Negative: New headaches, neuro deficits, vision deficits, seizures  Physical Exam  BP 123/73 (BP Location: Left Arm)   Pulse 82   Temp 98.7 F (37.1 C) (Oral)   Resp 16   SpO2 94%  Gen:   Awake, no distress   Resp:  Normal effort  MSK:   Moves extremities without difficulty  Other:    Medical Decision Making  Medically screening exam initiated at 1:18 PM.  Appropriate orders placed.  [name] was informed that the remainder of the evaluation will be completed by another provider, this initial triage assessment does not replace that evaluation, and the importance of remaining in the ED until their evaluation is complete.     Anselm Pancoast, PA-C 01/15/21 1324    Koleen Distance, MD 01/15/21 [id number]

## 2021-03-29 ENCOUNTER — Other Ambulatory Visit: Payer: Self-pay | Admitting: Cardiology

## 2021-03-29 DIAGNOSIS — F411 Generalized anxiety disorder: Secondary | ICD-10-CM

## 2021-04-01 ENCOUNTER — Other Ambulatory Visit: Payer: Self-pay | Admitting: Cardiology

## 2021-04-01 DIAGNOSIS — F17200 Nicotine dependence, unspecified, uncomplicated: Secondary | ICD-10-CM

## 2021-04-21 ENCOUNTER — Ambulatory Visit (HOSPITAL_COMMUNITY)
Admission: EM | Admit: 2021-04-21 | Discharge: 2021-04-21 | Disposition: A | Discharge status: Home / Self Care | Payer: 59 | Attending: Emergency Medicine | Admitting: Emergency Medicine

## 2021-04-21 ENCOUNTER — Other Ambulatory Visit: Payer: Self-pay

## 2021-04-21 ENCOUNTER — Encounter (HOSPITAL_COMMUNITY): Payer: Self-pay

## 2021-04-21 DIAGNOSIS — I200 Unstable angina: Secondary | ICD-10-CM | POA: Diagnosis not present

## 2021-04-21 NOTE — ED Triage Notes (Signed)
Pt is here with chest tightness that started Sunday, pt states he has a hx of heart problems. Pt states he took a nitro glycerin yesterday at 8p, has not taken any meds outside of his normal meds.
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
## 2021-04-21 NOTE — Discharge Instructions (Addendum)
Your EKG today showed that your heart is beating in a normal rhythm and pace however it is concerning that you are still having chest pain that is unrelieved by use of nitroglycerin therefore it warrants further evaluation  While it does not appear that you are having a heart attack today I cannot give you a definitive answer that something just as serious is not going on such as your stents being blocked  Please contact your cardiologist today to see if you can be seen within the next day  At any point if your chest pain continues, if it resolves and then starts again, he began to have shortness of breath, blurry vision, seeing spots, abdominal pain, nausea, dizziness or lightheadedness please go to the nearest emergency department for a full cardiac work-up

## 2021-06-04 ENCOUNTER — Ambulatory Visit: Payer: 59

## 2021-06-04 ENCOUNTER — Other Ambulatory Visit: Payer: Self-pay

## 2021-06-04 DIAGNOSIS — I6523 Occlusion and stenosis of bilateral carotid arteries: Secondary | ICD-10-CM

## 2021-06-08 ENCOUNTER — Ambulatory Visit: Payer: 59 | Admitting: Cardiology

## 2021-06-11 ENCOUNTER — Encounter: Payer: Self-pay | Admitting: Cardiology

## 2021-06-11 ENCOUNTER — Ambulatory Visit: Payer: 59 | Admitting: Cardiology

## 2021-06-11 ENCOUNTER — Other Ambulatory Visit: Payer: Self-pay

## 2021-06-11 VITALS — BP 144/82 | HR 89 | Temp 96.5°F | Resp 16 | Ht 71.0 in | Wt 247.0 lb

## 2021-06-11 DIAGNOSIS — Q231 Congenital insufficiency of aortic valve: Secondary | ICD-10-CM

## 2021-06-11 DIAGNOSIS — I10 Essential (primary) hypertension: Secondary | ICD-10-CM

## 2021-06-11 DIAGNOSIS — I201 Angina pectoris with documented spasm: Secondary | ICD-10-CM

## 2021-06-11 DIAGNOSIS — F411 Generalized anxiety disorder: Secondary | ICD-10-CM

## 2021-06-11 DIAGNOSIS — I6523 Occlusion and stenosis of bilateral carotid arteries: Secondary | ICD-10-CM

## 2021-06-11 MED ORDER — VERAPAMIL HCL ER 240 MG PO TBCR: 240.0000 mg | EXTENDED_RELEASE_TABLET | Freq: Every day | ORAL | 2 refills | Status: DC

## 2021-06-11 MED ORDER — PROPRANOLOL HCL ER 60 MG PO CP24: 60.0000 mg | ORAL_CAPSULE | Freq: Every day | ORAL | 2 refills | Status: DC
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
## 2021-06-19 ENCOUNTER — Ambulatory Visit: Payer: 59 | Admitting: Cardiology

## 2021-07-20 ENCOUNTER — Emergency Department (HOSPITAL_COMMUNITY)
Admission: EM | Admit: 2021-07-20 | Discharge: 2021-07-20 | Disposition: A | Discharge status: Home / Self Care | Payer: 59 | Attending: Emergency Medicine | Admitting: Emergency Medicine

## 2021-07-20 ENCOUNTER — Encounter (HOSPITAL_COMMUNITY): Payer: Self-pay | Admitting: Emergency Medicine

## 2021-07-20 ENCOUNTER — Emergency Department (HOSPITAL_COMMUNITY): Payer: 59

## 2021-07-20 DIAGNOSIS — Z79899 Other long term (current) drug therapy: Secondary | ICD-10-CM | POA: Diagnosis not present

## 2021-07-20 DIAGNOSIS — R0789 Other chest pain: Secondary | ICD-10-CM

## 2021-07-20 DIAGNOSIS — R519 Headache, unspecified: Secondary | ICD-10-CM | POA: Insufficient documentation

## 2021-07-20 DIAGNOSIS — Z87891 Personal history of nicotine dependence: Secondary | ICD-10-CM | POA: Diagnosis not present

## 2021-07-20 DIAGNOSIS — R072 Precordial pain: Secondary | ICD-10-CM | POA: Insufficient documentation

## 2021-07-20 DIAGNOSIS — R0602 Shortness of breath: Secondary | ICD-10-CM | POA: Diagnosis not present

## 2021-07-20 DIAGNOSIS — R110 Nausea: Secondary | ICD-10-CM | POA: Diagnosis not present

## 2021-07-20 LAB — CBC
HCT: 45.5 % (ref 39.0–52.0)
Hemoglobin: 15.2 g/dL (ref 13.0–17.0)
MCH: 29 pg (ref 26.0–34.0)
MCHC: 33.4 g/dL (ref 30.0–36.0)
MCV: 86.8 fL (ref 80.0–100.0)
Platelets: 227 10*3/uL (ref 150–400)
RBC: 5.24 MIL/uL (ref 4.22–5.81)
RDW: 12.8 % (ref 11.5–15.5)
WBC: 10.3 10*3/uL (ref 4.0–10.5)
nRBC: 0 % (ref 0.0–0.2)

## 2021-07-20 LAB — BASIC METABOLIC PANEL
Anion gap: 8 (ref 5–15)
BUN: 14 mg/dL (ref 6–20)
CO2: 29 mmol/L (ref 22–32)
Calcium: 9.4 mg/dL (ref 8.9–10.3)
Chloride: 101 mmol/L (ref 98–111)
Creatinine, Ser: 0.84 mg/dL (ref 0.61–1.24)
GFR, Estimated: >60 mL/min (ref >60)
Glucose, Bld: 94 mg/dL (ref 70–99)
Potassium: 4.5 mmol/L (ref 3.5–5.1)
Sodium: 138 mmol/L (ref 135–145)

## 2021-07-20 LAB — TROPONIN I (HIGH SENSITIVITY)
Troponin I (High Sensitivity): 3 ng/L (ref <18)
Troponin I (High Sensitivity): 3 ng/L (ref <18)
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
## 2021-07-20 NOTE — ED Notes (Signed)
Pt verbalizes understanding of discharge instructions. Opportunity for questions and answers were provided. Pt discharged from the ED.   ?

## 2021-07-20 NOTE — ED Triage Notes (Signed)
Patient BIB [facility] from urgent care for evaluation of chest pressure that started around mignight last night, history of cardiac stenosis. Patient has prescription for NTG but does not take it. Pain is described as 6/10 in center of chest, patient states he became concerned when he became diaphoretic while getting dressed this morning. Patient received 324mg  ASA and 1x SL NTG PTA, reports not change in pain after NTG. Patient alert, oriented, and in no apparent distress at this time.

## 2021-07-20 NOTE — ED Provider Notes (Signed)
Emergency Medicine Provider Triage Evaluation Note  [name] , a 45 y.o. male  was evaluated in triage.  Pt complains of chest pain / pressure since midnight last night. Difficulty sleeping, worse this morning with stress / exertion. Given NTG / ASA PTA without improvement. Denies nausea / vomiting / radiation. No hx of ACS. Known carotid artery plaques, known heart murmur. Family hx ACS. No personal hx DM, ACS. Hx of tobacco smoking, not currently smoking. Denies SOB  Review of Systems  Positive: As above Negative: As above  Physical Exam  There were no vitals taken for this visit. Gen:   Awake, no distress   Resp:  Normal effort  MSK:   Moves extremities without difficulty  Other:  Systolic murmur auscultated  Medical Decision Making  Medically screening exam initiated at 10:58 AM.  Appropriate orders placed.  Fremon Zacharia Panjwani was informed that the remainder of the evaluation will be completed by another provider, this initial triage assessment does not replace that evaluation, and the importance of remaining in the ED until their evaluation is complete.  Chest pain workup   [place] 07/20/21 1100    Ernie Avena, MD 07/20/21 1215

## 2021-07-23 ENCOUNTER — Ambulatory Visit: Payer: 59 | Admitting: Cardiology

## 2021-07-31 ENCOUNTER — Encounter: Payer: Self-pay | Admitting: Cardiology

## 2021-07-31 ENCOUNTER — Ambulatory Visit: Payer: 59 | Admitting: Cardiology

## 2021-07-31 ENCOUNTER — Other Ambulatory Visit: Payer: Self-pay

## 2021-07-31 VITALS — BP 154/94 | HR 68 | Temp 98.3°F | Resp 17 | Ht 71.0 in | Wt 251.6 lb

## 2021-07-31 DIAGNOSIS — F411 Generalized anxiety disorder: Secondary | ICD-10-CM

## 2021-07-31 DIAGNOSIS — I10 Essential (primary) hypertension: Secondary | ICD-10-CM

## 2021-07-31 DIAGNOSIS — I201 Angina pectoris with documented spasm: Secondary | ICD-10-CM

## 2021-07-31 MED ORDER — ISOSORBIDE MONONITRATE ER 30 MG PO TB24: 30.0000 mg | ORAL_TABLET | Freq: Every day | ORAL | 3 refills | Status: DC
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
## 2021-09-11 ENCOUNTER — Ambulatory Visit: Payer: 59 | Admitting: Student

## 2021-09-11 DIAGNOSIS — I10 Essential (primary) hypertension: Secondary | ICD-10-CM

## 2021-09-24 ENCOUNTER — Ambulatory Visit (HOSPITAL_COMMUNITY)
Admission: EM | Admit: 2021-09-24 | Discharge: 2021-09-24 | Disposition: A | Discharge status: Home / Self Care | Payer: 59 | Attending: Family Medicine | Admitting: Family Medicine

## 2021-09-24 ENCOUNTER — Encounter (HOSPITAL_COMMUNITY): Payer: Self-pay

## 2021-09-24 ENCOUNTER — Ambulatory Visit (INDEPENDENT_AMBULATORY_CARE_PROVIDER_SITE_OTHER): Payer: 59

## 2021-09-24 ENCOUNTER — Other Ambulatory Visit: Payer: Self-pay

## 2021-09-24 DIAGNOSIS — M545 Low back pain, unspecified: Secondary | ICD-10-CM

## 2021-09-24 DIAGNOSIS — G8929 Other chronic pain: Secondary | ICD-10-CM | POA: Diagnosis not present

## 2021-09-24 NOTE — ED Triage Notes (Signed)
Pt presents for recurring back pain. He is requesting a referral to orthopedic.
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
## 2021-09-24 NOTE — Discharge Instructions (Addendum)
You were seen today for chronic back pain.  Your xray appears stable compared to previous. No new concerns.  As discussed, you should make an appointment with a primary care provider for further care and discussion.  Please call [phone] for an appointment.  I recommend you take over the counter tylenol or motrin for your pain.  Also recommend ice.  I have given you home exercises to do as well, as this will help strengthen your core and help alleviate stress on the spine.   I
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
## 2021-10-15 ENCOUNTER — Encounter: Payer: Self-pay | Admitting: Student

## 2021-10-15 ENCOUNTER — Ambulatory Visit: Payer: 59 | Admitting: Student

## 2021-10-15 ENCOUNTER — Other Ambulatory Visit: Payer: Self-pay

## 2021-10-15 VITALS — BP 107/71 | HR 71 | Temp 97.3°F | Resp 16 | Ht 71.0 in | Wt 261.0 lb

## 2021-10-15 DIAGNOSIS — F411 Generalized anxiety disorder: Secondary | ICD-10-CM

## 2021-10-15 DIAGNOSIS — I201 Angina pectoris with documented spasm: Secondary | ICD-10-CM

## 2021-10-15 MED ORDER — FLUOXETINE HCL 20 MG PO CAPS: 20.0000 mg | ORAL_CAPSULE | Freq: Every day | ORAL | 3 refills | Status: DC

## 2021-11-12 ENCOUNTER — Other Ambulatory Visit: Payer: Self-pay | Admitting: Student

## 2021-12-10 ENCOUNTER — Encounter: Payer: Self-pay | Admitting: Student

## 2021-12-10 ENCOUNTER — Ambulatory Visit: Payer: 59 | Admitting: Student

## 2021-12-10 VITALS — BP 148/73 | HR 68 | Temp 98.4°F | Resp 17 | Ht 71.0 in | Wt 250.0 lb

## 2021-12-10 DIAGNOSIS — Q231 Congenital insufficiency of aortic valve: Secondary | ICD-10-CM

## 2021-12-10 DIAGNOSIS — F411 Generalized anxiety disorder: Secondary | ICD-10-CM

## 2021-12-10 DIAGNOSIS — I201 Angina pectoris with documented spasm: Secondary | ICD-10-CM

## 2021-12-10 DIAGNOSIS — I6523 Occlusion and stenosis of bilateral carotid arteries: Secondary | ICD-10-CM

## 2021-12-10 MED ORDER — AMLODIPINE BESYLATE 10 MG PO TABS: 10.0000 mg | ORAL_TABLET | Freq: Every day | ORAL | 3 refills | Status: DC
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
## 2022-02-28 ENCOUNTER — Other Ambulatory Visit: Payer: Self-pay | Admitting: Cardiology

## 2022-02-28 DIAGNOSIS — E785 Hyperlipidemia, unspecified: Secondary | ICD-10-CM

## 2022-03-07 ENCOUNTER — Ambulatory Visit (HOSPITAL_COMMUNITY)
Admission: EM | Admit: 2022-03-07 | Discharge: 2022-03-07 | Disposition: A | Discharge status: Home / Self Care | Payer: 59

## 2022-03-07 ENCOUNTER — Encounter (HOSPITAL_COMMUNITY): Payer: Self-pay

## 2022-03-07 DIAGNOSIS — L249 Irritant contact dermatitis, unspecified cause: Secondary | ICD-10-CM | POA: Diagnosis not present

## 2022-03-07 DIAGNOSIS — M7989 Other specified soft tissue disorders: Secondary | ICD-10-CM

## 2022-03-07 MED ORDER — FUROSEMIDE 20 MG PO TABS: 20.0000 mg | ORAL_TABLET | Freq: Every day | ORAL | 0 refills | Status: DC

## 2022-03-07 MED ORDER — TRIAMCINOLONE ACETONIDE 0.1 % EX CREA: 1.0000 | TOPICAL_CREAM | Freq: Two times a day (BID) | CUTANEOUS | 0 refills | Status: DC

## 2022-03-07 MED ORDER — AMOXICILLIN-POT CLAVULANATE 875-125 MG PO TABS: 1.0000 | ORAL_TABLET | Freq: Two times a day (BID) | ORAL | 0 refills | Status: DC

## 2022-03-07 MED ORDER — POTASSIUM CHLORIDE ER 10 MEQ PO TBCR: 10.0000 meq | EXTENDED_RELEASE_TABLET | Freq: Every day | ORAL | 0 refills | Status: DC

## 2022-03-07 NOTE — Discharge Instructions (Addendum)
Take Augmentin antibiotic twice daily for the next 7 days with food to avoid stomach upset.  This will treat the infection to your bilateral lower legs.  Apply triamcinolone cream to your rash twice daily for the next 5 to 7 days.  This is a steroid cream that will help reduce the inflammation to your rash.  Do not use this longer than 7 to 10 days as this is a steroid cream and is not indicated for long-term use.  Take Lasix and potassium together once daily in the morning to reduce the swelling in your legs.  This will cause you to urinate more frequently to reduce the fluid on your legs.  Wear your compression stockings and elevate your lower legs to reduce the swelling.   Your work note was at the end of your packet.  Follow-up with your PCP for ongoing management of your leg swelling.  If you develop any new or worsening symptoms or do not improve in the next 2 to 3 days, please return.  If your symptoms are severe, please go to the emergency room.  Follow-up with your primary care provider for further evaluation and management of your symptoms as well as ongoing wellness visits.  I hope you feel better!
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
## 2022-03-07 NOTE — ED Triage Notes (Signed)
Rash and Bilateral foot swelling x 3 days ago. Patient states he does not recall being bitten by anything or around poison ivy or anything of that nature.  Denies Pain or itches     No self harm or harm to other   Pain 0/10  -Saintclair Halsted Dunning, RT

## 2022-03-07 NOTE — ED Notes (Addendum)
Rash and Bilateral foot swelling x 3 days ago. Patient states he does not recall being bitten by anything or around poison ivy or anything of that nature.  Denies Pain or itches   No self harm or harm to other  Pain 0/10

## 2022-04-19 ENCOUNTER — Emergency Department (HOSPITAL_COMMUNITY)
Admission: EM | Admit: 2022-04-19 | Discharge: 2022-04-20 | Disposition: A | Discharge status: Home / Self Care | Payer: 59 | Attending: Student | Admitting: Student

## 2022-04-19 ENCOUNTER — Other Ambulatory Visit: Payer: Self-pay

## 2022-04-19 ENCOUNTER — Encounter (HOSPITAL_COMMUNITY): Payer: Self-pay | Admitting: Emergency Medicine

## 2022-04-19 DIAGNOSIS — I10 Essential (primary) hypertension: Secondary | ICD-10-CM | POA: Diagnosis not present

## 2022-04-19 DIAGNOSIS — I2510 Atherosclerotic heart disease of native coronary artery without angina pectoris: Secondary | ICD-10-CM | POA: Diagnosis not present

## 2022-04-19 DIAGNOSIS — Z20822 Contact with and (suspected) exposure to covid-19: Secondary | ICD-10-CM | POA: Insufficient documentation

## 2022-04-19 DIAGNOSIS — R110 Nausea: Secondary | ICD-10-CM | POA: Insufficient documentation

## 2022-04-19 DIAGNOSIS — Z79899 Other long term (current) drug therapy: Secondary | ICD-10-CM | POA: Diagnosis not present

## 2022-04-19 DIAGNOSIS — R0981 Nasal congestion: Secondary | ICD-10-CM | POA: Diagnosis present

## 2022-04-19 DIAGNOSIS — J069 Acute upper respiratory infection, unspecified: Secondary | ICD-10-CM | POA: Diagnosis not present

## 2022-04-19 NOTE — Discharge Instructions (Addendum)
Likely a viral infection, recommend over-the-counter pain medications like ibuprofen Tylenol for fever and pain control, nasal decongestions like Flonase and Zyrtec, Mucinex for cough.  If not eating recommend supplementing with Gatorade to help with electrolyte supplementation.  Your test results will be found on your MyChart account, if you test positive for COVID or influenza like you to contact your primary care provider for antiviral treatment.  Follow-up PCP for further evaluation.  Come back to the emergency department if you develop chest pain, shortness of breath, severe abdominal pain, uncontrolled nausea, vomiting, diarrhea.

## 2022-04-19 NOTE — ED Provider Notes (Signed)
[facility] EMERGENCY DEPARTMENT Provider Note   CSN: [id number] Arrival date & time: 04/19/22  2156     History  Chief Complaint  Patient presents with   Nasal Congestion   Nausea    [name] is a 46 y.o. male.  HPI   Medical history including CA D, aortic valve insufficiency hypertension presents with complaint of URI-like symptoms.  Symptoms started toda, he endorses congestion in his ears, nose, and face, slight cough which is nonproductive, no sore throat, no chest pain no shortness of breath no stomach pains with intermittent nausea no vomiting, with general body aches, still tolerating p.o., denies any shortness of breath or wheezing.  Patient states that he was recently with his brother who is sick with the same illness, states he is unsure if he got his COVID-vaccine, he has not gotten his influenza vaccine.  He has no other complaints.    Home Medications Prior to Admission medications   Medication Sig Start Date End Date Taking? Authorizing Provider  amLODipine (NORVASC) 10 MG tablet Take 1 tablet (10 mg total) by mouth daily. 12/10/21   Cantwell, Celeste C, PA-C  amoxicillin-clavulanate (AUGMENTIN) 875-125 MG tablet Take 1 tablet by mouth every 12 (twelve) hours. 03/07/22   Talbot Grumbling, FNP  ARIPiprazole (ABILIFY) 10 MG tablet Take 10 mg by mouth daily. 07/26/21   [provider]  atorvastatin (LIPITOR) 10 MG tablet TAKE 1 TABLET BY MOUTH EVERY DAY 03/01/22   Adrian Prows, MD  Buprenorphine HCl-Naloxone HCl 12-3 MG FILM Place under the tongue.    [provider]  furosemide (LASIX) 20 MG tablet Take 1 tablet (20 mg total) by mouth daily for 3 days. 03/07/22 03/10/22  Talbot Grumbling, FNP  nitroGLYCERIN (NITROSTAT) 0.4 MG SL tablet PLACE 1 TABLET (0.4 MG TOTAL) UNDER THE TONGUE EVERY 5 (FIVE) MINUTES AS NEEDED FOR UP TO 25 DAYS FOR CHEST PAIN. 09/25/20 10/15/21  Adrian Prows, MD  potassium chloride (KLOR-CON) 10 MEQ tablet  Take 1 tablet (10 mEq total) by mouth daily. 03/07/22   Talbot Grumbling, FNP  sertraline (ZOLOFT) 100 MG tablet Take 2 tablets by mouth daily. 10/24/21   [provider]  triamcinolone cream (KENALOG) 0.1 % Apply 1 Application topically 2 (two) times daily. 03/07/22   Talbot Grumbling, FNP      Allergies    Patient has no known allergies.    Review of Systems   Review of Systems  Constitutional:  Negative for chills and fever.  HENT:  Positive for congestion.   Respiratory:  Positive for cough. Negative for shortness of breath.   Cardiovascular:  Negative for chest pain.  Gastrointestinal:  Positive for nausea. Negative for abdominal pain and vomiting.  Musculoskeletal:  Positive for myalgias.  Neurological:  Negative for headaches.    Physical Exam Updated Vital Signs BP 119/78 (BP Location: Left Arm)   Pulse 95   Temp 98.8 F (37.1 C) (Oral)   Resp 20   Ht 5\' 11"  (1.803 m)   Wt 113.4 kg   SpO2 95%   BMI 34.87 kg/m  Physical Exam Vitals and nursing note reviewed.  Constitutional:      General: He is not in acute distress.    Appearance: He is not ill-appearing.  HENT:     Head: Normocephalic and atraumatic.     Right Ear: Tympanic membrane, ear canal and external ear normal.     Left Ear: Tympanic membrane, ear canal and external ear  normal.     Nose: Congestion present.     Mouth/Throat:     Mouth: Mucous membranes are moist.     Pharynx: Oropharynx is clear. No oropharyngeal exudate or posterior oropharyngeal erythema.     Comments: No trismus no torticollis no oral edema, tonsils both equal symmetric bilaterally, tolerating oral secretions, no submandibular swelling, no muffled tone voice Eyes:     Conjunctiva/sclera: Conjunctivae normal.  Cardiovascular:     Rate and Rhythm: Normal rate and regular rhythm.     Pulses: Normal pulses.     Heart sounds: No murmur heard.    No friction rub. No gallop.  Pulmonary:     Effort: No respiratory  distress.     Breath sounds: No wheezing, rhonchi or rales.     Comments: No evidence of respiratory distress, nontachypneic nonhypoxic speaking full sentences lung sounds are clear bilaterally Skin:    General: Skin is warm and dry.  Neurological:     Mental Status: He is alert.  Psychiatric:        Mood and Affect: Mood normal.     ED Results / Procedures / Treatments   Labs (all labs ordered are listed, but only abnormal results are displayed) Labs Reviewed  RESP PANEL BY RT-PCR (FLU A&B, COVID) [id number]    EKG None  Radiology No results found.  Procedures Procedures    Medications Ordered in ED Medications - No data to display  ED Course/ Medical Decision Making/ A&P                           Medical Decision Making  This patient presents to the ED for concern of URI, this involves an extensive number of treatment options, and is a complaint that carries with it a high risk of complications and morbidity.  The differential diagnosis includes pneumonia, CHF, PE    Additional history obtained:  Additional history obtained from N/A External records from outside source obtained and reviewed including cardiac notes   Co morbidities that complicate the patient evaluation  CAD  Social Determinants of Health:   N/A    Lab Tests:  I Ordered, and personally interpreted labs.  The pertinent results include: Respiratory panel   Imaging Studies ordered:  I ordered imaging studies including N/A I independently visualized and interpreted imaging which showed N/A I agree with the radiologist interpretation   Cardiac Monitoring:  The patient was maintained on a cardiac monitor.  I personally viewed and interpreted the cardiac monitored which showed an underlying rhythm of: N/A   Medicines ordered and prescription drug management:  I ordered medication including N/A I have reviewed the patients home medicines and have made adjustments as needed  Critical  Interventions:  N/A   Reevaluation: Benign physical exam, agreement with plan and discharge.  Consultations Obtained:  N/A  Test Considered:  Chest x-ray-deferred as my suspicion for pneumonia is low at this time lung sounds are clear bilaterally, atypical to develop pneumonia with less than 24 hours.    Rule out Low suspicion for systemic infection as patient is nontoxic-appearing, vital signs reassuring, no obvious source infection noted on exam.  Low suspicion for pneumonia as lung sounds are clear bilaterally, x-ray did not reveal any acute findings.  I have low suspicion for PE as patient denies pleuritic chest pain, shortness of breath, patient is PERC. low suspicion for strep throat as oropharynx was visualized, no erythema or exudates noted.  Low suspicion  patient would need  hospitalized due to viral infection or Covid as vital signs reassuring, patient is not in respiratory distress.      Dispostion and problem list  After consideration of the diagnostic results and the patients response to treatment, I feel that the patent would benefit from discharge.   URI-likely viral in nature, will recommend symptom management, due to patient's comorbidities if the test is positive for COVID or flu I would recommend antiviral treatment, patient was made aware to follow-up on his MyChart account and to contact his PCP for treatment.            Final Clinical Impression(s) / ED Diagnoses Final diagnoses:  Viral upper respiratory tract infection    Rx / DC Orders ED Discharge Orders     None         Marcello Fennel, PA-C 04/19/22 2343    Fatima Blank, MD 04/20/22 [phone]
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
## 2022-04-20 LAB — RESP PANEL BY RT-PCR (FLU A&B, COVID) ARPGX2
Influenza A by PCR: NEGATIVE
Influenza B by PCR: NEGATIVE
SARS Coronavirus 2 by RT PCR: NEGATIVE

## 2022-06-02 ENCOUNTER — Ambulatory Visit: Payer: 59

## 2022-06-02 DIAGNOSIS — I6523 Occlusion and stenosis of bilateral carotid arteries: Secondary | ICD-10-CM

## 2022-06-02 DIAGNOSIS — Q231 Congenital insufficiency of aortic valve: Secondary | ICD-10-CM

## 2022-06-09 ENCOUNTER — Ambulatory Visit: Payer: 59

## 2022-06-09 ENCOUNTER — Ambulatory Visit: Payer: 59 | Admitting: Student

## 2022-06-09 VITALS — BP 115/78 | HR 76 | Ht 71.0 in | Wt 268.8 lb

## 2022-06-09 DIAGNOSIS — I10 Essential (primary) hypertension: Secondary | ICD-10-CM

## 2022-06-09 DIAGNOSIS — R0789 Other chest pain: Secondary | ICD-10-CM

## 2022-06-09 DIAGNOSIS — F411 Generalized anxiety disorder: Secondary | ICD-10-CM

## 2022-06-09 DIAGNOSIS — Q2381 Bicuspid aortic valve: Secondary | ICD-10-CM

## 2022-06-09 DIAGNOSIS — Q231 Congenital insufficiency of aortic valve: Secondary | ICD-10-CM

## 2022-06-09 DIAGNOSIS — I6523 Occlusion and stenosis of bilateral carotid arteries: Secondary | ICD-10-CM

## 2022-06-09 MED ORDER — RANOLAZINE ER 500 MG PO TB12: 500.0000 mg | ORAL_TABLET | Freq: Two times a day (BID) | ORAL | 3 refills | Status: DC
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
## 2022-06-15 ENCOUNTER — Ambulatory Visit (HOSPITAL_COMMUNITY)
Admission: EM | Admit: 2022-06-15 | Discharge: 2022-06-15 | Disposition: A | Discharge status: Home / Self Care | Payer: 59 | Attending: Emergency Medicine | Admitting: Emergency Medicine

## 2022-06-15 ENCOUNTER — Encounter (HOSPITAL_COMMUNITY): Payer: Self-pay | Admitting: Emergency Medicine

## 2022-06-15 ENCOUNTER — Ambulatory Visit (HOSPITAL_COMMUNITY)
Admission: EM | Admit: 2022-06-15 | Discharge: 2022-06-15 | Disposition: A | Discharge status: Home / Self Care | Payer: 59

## 2022-06-15 DIAGNOSIS — K5900 Constipation, unspecified: Secondary | ICD-10-CM

## 2022-06-15 MED ORDER — POLYETHYLENE GLYCOL 4500 POWD: 17.0000 g | 0 refills | Status: DC | PRN

## 2022-06-15 NOTE — Discharge Instructions (Signed)
As discussed I recommend miralax cleanse out (polyethylene glycol) Do 4 capfuls dissolved in 4 glasses of water on day 1. You should have a bowel movement within 8-9 hours.  Then do 1 capful daily for the next 4 days to keep stool soft.  If you are unable to have a bowel movement still, or develop severe pain, please go to the emergency department.

## 2022-06-15 NOTE — ED Triage Notes (Signed)
Pt reports constipation x 5/6 days. States has tried mineral oil, extra fiber, oatmeal, and maalox with no relief. States he has a small hard bowel movement this morning but noticed blood shortly after.
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
## 2022-07-21 ENCOUNTER — Other Ambulatory Visit: Payer: Self-pay

## 2022-07-21 DIAGNOSIS — E785 Hyperlipidemia, unspecified: Secondary | ICD-10-CM

## 2022-07-21 DIAGNOSIS — R0789 Other chest pain: Secondary | ICD-10-CM

## 2022-07-21 MED ORDER — AMLODIPINE BESYLATE 10 MG PO TABS: 10.0000 mg | ORAL_TABLET | Freq: Every day | ORAL | 3 refills | Status: DC

## 2022-07-21 MED ORDER — NITROGLYCERIN 0.4 MG SL SUBL: SUBLINGUAL_TABLET | SUBLINGUAL | 3 refills | Status: DC

## 2022-07-21 MED ORDER — ATORVASTATIN CALCIUM 10 MG PO TABS: 10.0000 mg | ORAL_TABLET | Freq: Every day | ORAL | 3 refills | Status: DC

## 2022-07-21 MED ORDER — RANOLAZINE ER 500 MG PO TB12: 500.0000 mg | ORAL_TABLET | Freq: Two times a day (BID) | ORAL | 3 refills | Status: DC

## 2022-07-21 MED ORDER — CLONIDINE HCL 0.2 MG PO TABS: 0.2000 mg | ORAL_TABLET | Freq: Two times a day (BID) | ORAL | 3 refills | Status: DC

## 2022-12-03 ENCOUNTER — Emergency Department (HOSPITAL_COMMUNITY): Payer: 59

## 2022-12-03 ENCOUNTER — Emergency Department (HOSPITAL_COMMUNITY)
Admission: EM | Admit: 2022-12-03 | Discharge: 2022-12-03 | Disposition: A | Discharge status: Home / Self Care | Payer: 59 | Attending: Emergency Medicine | Admitting: Emergency Medicine

## 2022-12-03 ENCOUNTER — Encounter (HOSPITAL_COMMUNITY): Payer: Self-pay

## 2022-12-03 ENCOUNTER — Other Ambulatory Visit: Payer: Self-pay

## 2022-12-03 DIAGNOSIS — R2243 Localized swelling, mass and lump, lower limb, bilateral: Secondary | ICD-10-CM | POA: Diagnosis not present

## 2022-12-03 DIAGNOSIS — Z79899 Other long term (current) drug therapy: Secondary | ICD-10-CM | POA: Diagnosis not present

## 2022-12-03 DIAGNOSIS — H6122 Impacted cerumen, left ear: Secondary | ICD-10-CM | POA: Diagnosis not present

## 2022-12-03 DIAGNOSIS — H9312 Tinnitus, left ear: Secondary | ICD-10-CM

## 2022-12-03 DIAGNOSIS — R079 Chest pain, unspecified: Secondary | ICD-10-CM | POA: Diagnosis present

## 2022-12-03 LAB — BASIC METABOLIC PANEL
Anion gap: 9 (ref 5–15)
BUN: 12 mg/dL (ref 6–20)
CO2: 27 mmol/L (ref 22–32)
Calcium: 9.3 mg/dL (ref 8.9–10.3)
Chloride: 102 mmol/L (ref 98–111)
Creatinine, Ser: 0.77 mg/dL (ref 0.61–1.24)
GFR, Estimated: >60 mL/min (ref >60)
Glucose, Bld: 108 mg/dL — ABNORMAL HIGH (ref 70–99)
Potassium: 4.6 mmol/L (ref 3.5–5.1)
Sodium: 138 mmol/L (ref 135–145)

## 2022-12-03 LAB — CBC
HCT: 44.2 % (ref 39.0–52.0)
Hemoglobin: 15.6 g/dL (ref 13.0–17.0)
MCH: 29.5 pg (ref 26.0–34.0)
MCHC: 35.3 g/dL (ref 30.0–36.0)
MCV: 83.7 fL (ref 80.0–100.0)
Platelets: 214 10*3/uL (ref 150–400)
RBC: 5.28 MIL/uL (ref 4.22–5.81)
RDW: 13.1 % (ref 11.5–15.5)
WBC: 9 10*3/uL (ref 4.0–10.5)
nRBC: 0 % (ref 0.0–0.2)

## 2022-12-03 LAB — TROPONIN I (HIGH SENSITIVITY): Troponin I (High Sensitivity): 4 ng/L (ref <18)

## 2022-12-03 NOTE — Discharge Instructions (Addendum)
Thank you for letting us take care of you.  Your labs and imaging today are reassuring. You do have a lot of wax in your left ear. I attached some instructions that may help with this. It is likely related to the nitro that you took. I do not see any other medications on your list that may cause this.  If you continue to have symptoms, please follow up with your PCP and ENT. I provided ENT for you to follow up with. For any new or worsening symptoms, please return to nearest ED for re-evaluation.

## 2022-12-03 NOTE — ED Notes (Signed)
Pt A&OX4 ambulatory with independent steady gait. Pt verbalized understanding d/c instructions and follow up care.

## 2022-12-03 NOTE — ED Triage Notes (Signed)
Pt arrived POV sent by UC. Pt reports taking NTG tablet yesterday and he started having tinnitus in L ear. Pt reports CP yesterday onset 1100, mid chest w/ radiation to L arm, described as 4/10 and pressure. Pt reports NTG did not relieve the pain. 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 Pt denies any CP at this time.
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
## 2022-12-08 ENCOUNTER — Ambulatory Visit: Payer: 59 | Admitting: Internal Medicine

## 2022-12-08 ENCOUNTER — Ambulatory Visit: Payer: 59

## 2022-12-08 ENCOUNTER — Encounter: Payer: Self-pay | Admitting: Internal Medicine

## 2022-12-08 VITALS — BP 139/86 | HR 115 | Ht 71.0 in | Wt 277.0 lb

## 2022-12-08 DIAGNOSIS — Q231 Congenital insufficiency of aortic valve: Secondary | ICD-10-CM

## 2022-12-08 DIAGNOSIS — E785 Hyperlipidemia, unspecified: Secondary | ICD-10-CM

## 2022-12-08 DIAGNOSIS — I10 Essential (primary) hypertension: Secondary | ICD-10-CM

## 2022-12-08 DIAGNOSIS — I201 Angina pectoris with documented spasm: Secondary | ICD-10-CM

## 2022-12-08 MED ORDER — METOPROLOL SUCCINATE ER 25 MG PO TB24
25.0000 mg | ORAL_TABLET | Freq: Every day | ORAL | 3 refills | Status: DC
Start: 2022-12-08 — End: 2023-01-10
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
## 2022-12-22 DIAGNOSIS — F1120 Opioid dependence, uncomplicated: Secondary | ICD-10-CM | POA: Diagnosis not present

## 2023-01-08 DIAGNOSIS — F1120 Opioid dependence, uncomplicated: Secondary | ICD-10-CM | POA: Diagnosis not present

## 2023-01-08 DIAGNOSIS — F419 Anxiety disorder, unspecified: Secondary | ICD-10-CM | POA: Diagnosis not present

## 2023-01-09 ENCOUNTER — Inpatient Hospital Stay (HOSPITAL_COMMUNITY)
Admission: AD | Admit: 2023-01-09 | Discharge: 2023-01-13 | DRG: 897 | Disposition: A | Discharge status: Home / Self Care | Payer: 59 | Source: Intra-hospital | Attending: Psychiatry | Admitting: Psychiatry

## 2023-01-09 ENCOUNTER — Encounter (HOSPITAL_COMMUNITY): Payer: Self-pay | Admitting: Pharmacy Technician

## 2023-01-09 ENCOUNTER — Emergency Department (HOSPITAL_COMMUNITY)
Admission: EM | Admit: 2023-01-09 | Discharge: 2023-01-09 | Disposition: A | Discharge status: Other Acute Inpatient Hospital | Payer: 59 | Attending: Emergency Medicine | Admitting: Emergency Medicine

## 2023-01-09 ENCOUNTER — Encounter (HOSPITAL_COMMUNITY): Payer: Self-pay | Admitting: Psychiatry

## 2023-01-09 ENCOUNTER — Other Ambulatory Visit: Payer: Self-pay

## 2023-01-09 DIAGNOSIS — F1121 Opioid dependence, in remission: Secondary | ICD-10-CM | POA: Diagnosis present

## 2023-01-09 DIAGNOSIS — R0789 Other chest pain: Secondary | ICD-10-CM | POA: Diagnosis not present

## 2023-01-09 DIAGNOSIS — Z79899 Other long term (current) drug therapy: Secondary | ICD-10-CM | POA: Diagnosis not present

## 2023-01-09 DIAGNOSIS — Z87891 Personal history of nicotine dependence: Secondary | ICD-10-CM | POA: Diagnosis not present

## 2023-01-09 DIAGNOSIS — Z635 Disruption of family by separation and divorce: Secondary | ICD-10-CM | POA: Diagnosis not present

## 2023-01-09 DIAGNOSIS — R739 Hyperglycemia, unspecified: Secondary | ICD-10-CM | POA: Diagnosis not present

## 2023-01-09 DIAGNOSIS — F1210 Cannabis abuse, uncomplicated: Secondary | ICD-10-CM | POA: Diagnosis present

## 2023-01-09 DIAGNOSIS — F329 Major depressive disorder, single episode, unspecified: Principal | ICD-10-CM | POA: Diagnosis present

## 2023-01-09 DIAGNOSIS — F411 Generalized anxiety disorder: Secondary | ICD-10-CM | POA: Diagnosis not present

## 2023-01-09 DIAGNOSIS — F1011 Alcohol abuse, in remission: Secondary | ICD-10-CM | POA: Diagnosis present

## 2023-01-09 DIAGNOSIS — R45851 Suicidal ideations: Secondary | ICD-10-CM | POA: Diagnosis present

## 2023-01-09 DIAGNOSIS — F319 Bipolar disorder, unspecified: Secondary | ICD-10-CM | POA: Diagnosis not present

## 2023-01-09 DIAGNOSIS — F1994 Other psychoactive substance use, unspecified with psychoactive substance-induced mood disorder: Secondary | ICD-10-CM | POA: Diagnosis not present

## 2023-01-09 DIAGNOSIS — Z79891 Long term (current) use of opiate analgesic: Secondary | ICD-10-CM

## 2023-01-09 DIAGNOSIS — F419 Anxiety disorder, unspecified: Secondary | ICD-10-CM | POA: Diagnosis not present

## 2023-01-09 DIAGNOSIS — F1091 Alcohol use, unspecified, in remission: Secondary | ICD-10-CM | POA: Diagnosis present

## 2023-01-09 DIAGNOSIS — F1320 Sedative, hypnotic or anxiolytic dependence, uncomplicated: Secondary | ICD-10-CM | POA: Diagnosis not present

## 2023-01-09 DIAGNOSIS — F1290 Cannabis use, unspecified, uncomplicated: Secondary | ICD-10-CM | POA: Diagnosis present

## 2023-01-09 DIAGNOSIS — I10 Essential (primary) hypertension: Secondary | ICD-10-CM | POA: Diagnosis present

## 2023-01-09 DIAGNOSIS — Z720 Tobacco use: Secondary | ICD-10-CM | POA: Diagnosis present

## 2023-01-09 DIAGNOSIS — R456 Violent behavior: Secondary | ICD-10-CM | POA: Diagnosis present

## 2023-01-09 DIAGNOSIS — Y900 Blood alcohol level of less than 20 mg/100 ml: Secondary | ICD-10-CM | POA: Diagnosis not present

## 2023-01-09 DIAGNOSIS — F1190 Opioid use, unspecified, uncomplicated: Secondary | ICD-10-CM | POA: Diagnosis present

## 2023-01-09 DIAGNOSIS — F17200 Nicotine dependence, unspecified, uncomplicated: Secondary | ICD-10-CM | POA: Diagnosis not present

## 2023-01-09 LAB — CBC
HCT: 46.9 % (ref 39.0–52.0)
Hemoglobin: 16 g/dL (ref 13.0–17.0)
MCH: 30 pg (ref 26.0–34.0)
MCHC: 34.1 g/dL (ref 30.0–36.0)
MCV: 88 fL (ref 80.0–100.0)
Platelets: 195 10*3/uL (ref 150–400)
RBC: 5.33 MIL/uL (ref 4.22–5.81)
RDW: 12.9 % (ref 11.5–15.5)
WBC: 9 10*3/uL (ref 4.0–10.5)
nRBC: 0 % (ref 0.0–0.2)

## 2023-01-09 LAB — COMPREHENSIVE METABOLIC PANEL
ALT: 48 U/L — ABNORMAL HIGH (ref 0–44)
AST: 36 U/L (ref 15–41)
Albumin: 3.8 g/dL (ref 3.5–5.0)
Alkaline Phosphatase: 79 U/L (ref 38–126)
Anion gap: 8 (ref 5–15)
BUN: 10 mg/dL (ref 6–20)
CO2: 29 mmol/L (ref 22–32)
Calcium: 9.5 mg/dL (ref 8.9–10.3)
Chloride: 102 mmol/L (ref 98–111)
Creatinine, Ser: 0.93 mg/dL (ref 0.61–1.24)
GFR, Estimated: >60 mL/min (ref >60)
Glucose, Bld: 149 mg/dL — ABNORMAL HIGH (ref 70–99)
Potassium: 3.5 mmol/L (ref 3.5–5.1)
Sodium: 139 mmol/L (ref 135–145)
Total Bilirubin: 0.4 mg/dL (ref 0.3–1.2)
Total Protein: 6.9 g/dL (ref 6.5–8.1)

## 2023-01-09 LAB — RAPID URINE DRUG SCREEN, HOSP PERFORMED
Amphetamines: NOT DETECTED
Barbiturates: NOT DETECTED
Benzodiazepines: POSITIVE — AB
Cocaine: NOT DETECTED
Opiates: NOT DETECTED
Tetrahydrocannabinol: POSITIVE — AB

## 2023-01-09 LAB — SALICYLATE LEVEL: Salicylate Lvl: 11.1 mg/dL (ref 7.0–30.0)

## 2023-01-09 LAB — ETHANOL: Alcohol, Ethyl (B): <10 mg/dL (ref <10)

## 2023-01-09 LAB — ACETAMINOPHEN LEVEL: Acetaminophen (Tylenol), Serum: <10 ug/mL — ABNORMAL LOW (ref 10–30)

## 2023-01-09 MED ORDER — BUPROPION HCL ER (XL) 150 MG PO TB24: 150.0000 mg | ORAL_TABLET | Freq: Every day | ORAL | Status: DC

## 2023-01-09 MED ORDER — HALOPERIDOL 5 MG PO TABS
5.0000 mg | ORAL_TABLET | Freq: Three times a day (TID) | ORAL | Status: DC | PRN
  Administered 2023-01-09: 5 mg via ORAL
  Filled 2023-01-09: qty 1

## 2023-01-09 MED ORDER — ALUM & MAG HYDROXIDE-SIMETH 200-200-20 MG/5ML PO SUSP
30.0000 mL | ORAL | Status: DC | PRN
  Administered 2023-01-11: 30 mL via ORAL
  Filled 2023-01-09: qty 30

## 2023-01-09 MED ORDER — CLONIDINE HCL 0.2 MG PO TABS: 0.2000 mg | ORAL_TABLET | Freq: Two times a day (BID) | ORAL | Status: DC

## 2023-01-09 MED ORDER — METOPROLOL SUCCINATE ER 25 MG PO TB24: 25.0000 mg | ORAL_TABLET | Freq: Every day | ORAL | Status: DC

## 2023-01-09 MED ORDER — CLONIDINE HCL 0.2 MG PO TABS
0.2000 mg | ORAL_TABLET | Freq: Two times a day (BID) | ORAL | Status: DC
  Administered 2023-01-09 – 2023-01-13 (×8): 0.2 mg via ORAL
  Filled 2023-01-09 (×2): qty 1
  Filled 2023-01-09 (×2): qty 2
  Filled 2023-01-09 (×8): qty 1

## 2023-01-09 MED ORDER — AMLODIPINE BESYLATE 10 MG PO TABS
10.0000 mg | ORAL_TABLET | Freq: Every day | ORAL | Status: DC
  Administered 2023-01-10 – 2023-01-13 (×4): 10 mg via ORAL
  Filled 2023-01-09 (×5): qty 1

## 2023-01-09 MED ORDER — DIPHENHYDRAMINE HCL 25 MG PO CAPS: 50.0000 mg | ORAL_CAPSULE | Freq: Three times a day (TID) | ORAL | Status: DC | PRN

## 2023-01-09 MED ORDER — AMLODIPINE BESYLATE 5 MG PO TABS
10.0000 mg | ORAL_TABLET | Freq: Every day | ORAL | Status: DC
  Administered 2023-01-09: 10 mg via ORAL
  Filled 2023-01-09: qty 2

## 2023-01-09 MED ORDER — TRAZODONE HCL 100 MG PO TABS: 100.0000 mg | ORAL_TABLET | Freq: Every evening | ORAL | Status: DC | PRN

## 2023-01-09 MED ORDER — ACETAMINOPHEN 325 MG PO TABS
650.0000 mg | ORAL_TABLET | Freq: Four times a day (QID) | ORAL | Status: DC | PRN
  Administered 2023-01-12 – 2023-01-13 (×2): 650 mg via ORAL
  Filled 2023-01-09 (×2): qty 2

## 2023-01-09 MED ORDER — BUPROPION HCL ER (XL) 150 MG PO TB24
150.0000 mg | ORAL_TABLET | Freq: Every day | ORAL | Status: DC
  Administered 2023-01-10: 150 mg via ORAL
  Filled 2023-01-09 (×3): qty 1

## 2023-01-09 MED ORDER — BUPRENORPHINE HCL-NALOXONE HCL 8-2 MG SL SUBL
1.0000 | SUBLINGUAL_TABLET | Freq: Every day | SUBLINGUAL | Status: DC
  Administered 2023-01-10 – 2023-01-13 (×4): 1 via SUBLINGUAL
  Filled 2023-01-09 (×4): qty 1

## 2023-01-09 MED ORDER — HYDROXYZINE HCL 50 MG PO TABS: 50.0000 mg | ORAL_TABLET | Freq: Three times a day (TID) | ORAL | Status: DC | PRN

## 2023-01-09 MED ORDER — BUPRENORPHINE HCL-NALOXONE HCL 2-0.5 MG SL SUBL
2.0000 | SUBLINGUAL_TABLET | Freq: Every day | SUBLINGUAL | Status: DC
  Administered 2023-01-10 – 2023-01-13 (×4): 2 via SUBLINGUAL
  Filled 2023-01-09 (×4): qty 2

## 2023-01-09 MED ORDER — DIPHENHYDRAMINE HCL 50 MG/ML IJ SOLN: 50.0000 mg | Freq: Three times a day (TID) | INTRAMUSCULAR | Status: DC | PRN

## 2023-01-09 MED ORDER — HYDROXYZINE HCL 50 MG PO TABS
50.0000 mg | ORAL_TABLET | Freq: Three times a day (TID) | ORAL | Status: DC | PRN
  Administered 2023-01-09: 50 mg via ORAL
  Filled 2023-01-09: qty 1

## 2023-01-09 MED ORDER — NITROGLYCERIN 0.4 MG SL SUBL: 0.4000 mg | SUBLINGUAL_TABLET | SUBLINGUAL | Status: DC | PRN

## 2023-01-09 MED ORDER — MAGNESIUM HYDROXIDE 400 MG/5ML PO SUSP: 30.0000 mL | Freq: Every day | ORAL | Status: DC | PRN

## 2023-01-09 MED ORDER — TRAZODONE HCL 100 MG PO TABS
100.0000 mg | ORAL_TABLET | Freq: Every evening | ORAL | Status: DC | PRN
  Administered 2023-01-09 – 2023-01-11 (×2): 100 mg via ORAL
  Filled 2023-01-09 (×4): qty 1

## 2023-01-09 MED ORDER — ATORVASTATIN CALCIUM 10 MG PO TABS
10.0000 mg | ORAL_TABLET | Freq: Every day | ORAL | Status: DC
  Administered 2023-01-09: 10 mg via ORAL
  Filled 2023-01-09: qty 1

## 2023-01-09 MED ORDER — HALOPERIDOL LACTATE 5 MG/ML IJ SOLN: 5.0000 mg | Freq: Three times a day (TID) | INTRAMUSCULAR | Status: DC | PRN

## 2023-01-09 MED ORDER — ATORVASTATIN CALCIUM 10 MG PO TABS
10.0000 mg | ORAL_TABLET | Freq: Every day | ORAL | Status: DC
  Administered 2023-01-10 – 2023-01-13 (×4): 10 mg via ORAL
  Filled 2023-01-09 (×5): qty 1

## 2023-01-09 NOTE — Progress Notes (Signed)
[facility] LCSW Progress Note   01/09/2023    6:27 PM  [name]   [id number]   Type of Contact and Topic:  Psychiatric Bed Placement   Pt accepted to [facility] 407-1     Patient meets inpatient criteria per Phebe Colla, NP    The attending provider will be Dr. Sherron Flemings  Call report to [phone]    Denton Ar, RN @ [facility] ED notified.     Pt scheduled  to arrive at [facility] TODAY @ 2000.   Damita Dunnings, MSW, LCSW-A  6:29 PM 01/09/2023
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
## 2023-01-09 NOTE — ED Notes (Signed)
Staffing was contacted regarding getting a sitter. Staffing informed this RN that sitter would not be available until 1500
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
## 2023-01-09 NOTE — ED Notes (Signed)
Pt's wife brought pt's Suboxone 12mg /3mg  inventoried (16 count) w/ ED pharmacist and to be sent w/ pt to [facility] for dispense.   Medication secure bag placed in pt's envelope containing pt's transfer paperwork.
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
## 2023-01-10 ENCOUNTER — Encounter (HOSPITAL_COMMUNITY): Payer: Self-pay | Admitting: Psychiatry

## 2023-01-10 ENCOUNTER — Other Ambulatory Visit: Payer: Self-pay

## 2023-01-10 ENCOUNTER — Encounter (HOSPITAL_COMMUNITY): Payer: Self-pay

## 2023-01-10 DIAGNOSIS — F1290 Cannabis use, unspecified, uncomplicated: Secondary | ICD-10-CM

## 2023-01-10 DIAGNOSIS — F1091 Alcohol use, unspecified, in remission: Secondary | ICD-10-CM | POA: Diagnosis present

## 2023-01-10 DIAGNOSIS — F1190 Opioid use, unspecified, uncomplicated: Secondary | ICD-10-CM

## 2023-01-10 DIAGNOSIS — F1320 Sedative, hypnotic or anxiolytic dependence, uncomplicated: Secondary | ICD-10-CM | POA: Diagnosis present

## 2023-01-10 DIAGNOSIS — F1121 Opioid dependence, in remission: Secondary | ICD-10-CM | POA: Diagnosis present

## 2023-01-10 HISTORY — DX: Alcohol use, unspecified, in remission: F10.91

## 2023-01-10 HISTORY — DX: Opioid use, unspecified, uncomplicated: F11.90

## 2023-01-10 HISTORY — DX: Cannabis use, unspecified, uncomplicated: F12.90

## 2023-01-10 HISTORY — DX: Sedative, hypnotic or anxiolytic dependence, uncomplicated: F13.20

## 2023-01-10 MED ORDER — GABAPENTIN 300 MG PO CAPS
300.0000 mg | ORAL_CAPSULE | Freq: Three times a day (TID) | ORAL | Status: DC
  Administered 2023-01-10 – 2023-01-13 (×8): 300 mg via ORAL
  Filled 2023-01-10 (×12): qty 1

## 2023-01-10 MED ORDER — HYDROXYZINE HCL 25 MG PO TABS
25.0000 mg | ORAL_TABLET | Freq: Four times a day (QID) | ORAL | Status: DC | PRN
  Administered 2023-01-11: 25 mg via ORAL
  Filled 2023-01-10 (×2): qty 1

## 2023-01-10 MED ORDER — ONDANSETRON 4 MG PO TBDP: 4.0000 mg | ORAL_TABLET | Freq: Four times a day (QID) | ORAL | Status: DC | PRN

## 2023-01-10 MED ORDER — ADULT MULTIVITAMIN W/MINERALS CH
1.0000 | ORAL_TABLET | Freq: Every day | ORAL | Status: DC
  Administered 2023-01-10 – 2023-01-13 (×4): 1 via ORAL
  Filled 2023-01-10 (×6): qty 1

## 2023-01-10 MED ORDER — FLUOXETINE HCL 20 MG PO CAPS
20.0000 mg | ORAL_CAPSULE | Freq: Every day | ORAL | Status: DC
  Administered 2023-01-11 – 2023-01-13 (×3): 20 mg via ORAL
  Filled 2023-01-10 (×4): qty 1

## 2023-01-10 MED ORDER — LOPERAMIDE HCL 2 MG PO CAPS: 2.0000 mg | ORAL_CAPSULE | ORAL | Status: DC | PRN

## 2023-01-10 MED ORDER — VITAMIN B-1 100 MG PO TABS
100.0000 mg | ORAL_TABLET | Freq: Every day | ORAL | Status: DC
  Administered 2023-01-11 – 2023-01-13 (×3): 100 mg via ORAL
  Filled 2023-01-10 (×4): qty 1
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
## 2023-01-10 NOTE — [facility] Suicide Risk Assessment (Signed)
[facility] Admission Suicide Risk Assessment  Nursing information obtained from: Patient Demographic factors: Male Current Mental Status: NA Loss Factors: Decline in physical health Historical Factors: Impulsivity Risk Reduction Factors: Positive social support, Sense of responsibility to family  Principal Problem: MDD (major depressive disorder) Diagnosis: Principal Problem:   MDD (major depressive disorder)   Subjective Data:  [name] is a 47 y.o. male with PMH of bipolar affective d/o v substance induced mood d/o, GAD, remote suicide attempt, inpatient psych admission, who presented to [facility] (01/09/2023), the admitted to [facility] the same day for suicidal ideation with a plan to shoot himself.  Home rx: Lipitor 10 mg, amlodipine 10 mg, Suboxone 12-3 mg BID, nitroglycerine 0.4 mg  PRN Was on zoloft 200 mg in 09/2022, has not filled it since.  Continued Clinical Symptoms:  Alcohol Use Disorder Identification Test Final Score (AUDIT): 0 The "Alcohol Use Disorders Identification Test", Guidelines for Use in Primary Care, Second Edition.  World Science writer [facility]). Score between 0-7:  no or low risk or alcohol related problems. Score between 8-15:  moderate risk of alcohol related problems. Score between 16-19:  high risk of alcohol related problems. Score 20 or above:  warrants further diagnostic evaluation for alcohol dependence and treatment.  CLINICAL FACTORS:  Severe Anxiety and/or Agitation Alcohol/Substance Abuse/Dependencies More than one psychiatric diagnosis Unstable or Poor Therapeutic Relationship Previous Psychiatric Diagnoses and Treatments  Musculoskeletal: Strength & Muscle Tone: within normal limits Gait & Station: normal Patient leans: N/A   Presentation  General Appearance:Disheveled Eye Contact:Fair Speech:Clear and Coherent, Slow Volume:Normal Handedness:Right  Mood and Affect  Mood:Depressed, Anxious Affect:Appropriate, Congruent, Full  Range  Thought Process  Thought Process:Coherent, Goal Directed, Linear Descriptions of Associations:Intact  Thought Content Suicidal Thoughts:Suicidal Thoughts: No (Denied active and passive SI) Homicidal Thoughts:Homicidal Thoughts: No Hallucinations:Hallucinations: None Ideas of Reference:None Thought Content:Rumination, Perseveration  Sensorium  Memory:Immediate Good Judgment:Impaired Insight:Shallow  Executive Functions  Orientation:Full (Time, Place and Person) Language:Good Concentration:Good Attention:Good Recall:Good Fund of Knowledge:Good  Psychomotor Activity  Psychomotor Activity:Psychomotor Activity: Normal  Assets  Assets:Communication Skills, Desire for Improvement, Resilience, Housing, Intimacy, Social Support, Transportation  Sleep  Quality:Fair   COGNITIVE FEATURES THAT CONTRIBUTE TO RISK:  Closed-mindedness, Loss of executive function, Polarized thinking, and Thought constriction (tunnel vision)    SUICIDE RISK:  Severe: Frequent, intense, and enduring suicidal ideation, specific plan, no subjective intent, but some objective markers of intent (i.e., choice of lethal method), the method is accessible, some limited preparatory behavior, evidence of impaired self-control, severe dysphoria/symptomatology, multiple risk factors present, and few if any protective factors, particularly a lack of social support.   PLAN OF CARE:  Patient met requirement for inpatient psychiatric hospitalization and has been admitted.  See H&P & attending's attestation for detailed plan.  I certify that inpatient services furnished can reasonably be expected to improve the patient's condition.  Total Time spent with patient:  See attending attestation  Signed: Princess Bruins, DO Psychiatry Resident, PGY-2 [facility]  [address] [place], [place] [address] Ph: [phone] Fax: [phone] 01/10/2023, 3:12 PM
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
## 2023-01-10 NOTE — Progress Notes (Signed)
   01/10/23 0000  Psych Admission Type (Psych Patients Only)  Admission Status Voluntary  Psychosocial Assessment  Patient Complaints Agitation;Anger;Anxiety;Decreased concentration;Crying spells;Depression;Hopelessness;Irritability;Insomnia;Panic attack;Nervousness;Sadness;Self-harm thoughts;Worrying;Shakiness  Biomedical scientist  Facial Expression Anxious  Affect Anxious  Speech Logical/coherent  Interaction Assertive  Motor Activity Other (Comment) (wnl)  Appearance/Hygiene Unremarkable  Behavior Characteristics Cooperative  Mood Pleasant  Thought Process  Coherency WDL  Content WDL (none reported or observed)  Delusions None reported or observed  Perception WDL  Hallucination None reported or observed  Judgment WDL  Confusion None  Danger to Self  Current suicidal ideation? Denies  Danger to Others  Danger to Others None reported or observed
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
## 2023-01-10 NOTE — [facility] Group Notes (Signed)
Spiritual care group on grief and loss facilitated by Chaplain Katy Iasha Mccalister, [facility]  Group Goal: Support / Education around grief and loss  Members engage in facilitated group support and psycho-social education.  Group Description:  Following introductions and group rules, group members engaged in facilitated group dialogue and support around topic of loss, with particular support around experiences of loss in their lives. Group Identified types of loss (relationships / self / things) and identified patterns, circumstances, and changes that precipitate losses. Reflected on thoughts / feelings around loss, normalized grief responses, and recognized variety in grief experience. Group encouraged individual reflection on safe space and on the coping skills that they are already utilizing.  Group drew on Adlerian / Rogerian and narrative framework  Patient Progress: Did not attend.  

## 2023-01-10 NOTE — Progress Notes (Signed)
Patient ID: [name], male   DOB: 1976/02/13, 47 y.o.   MRN: [id number] Arrived via safe transport from [facility] ED. Admitted to RM-[id number]. Alert/oriented. SI with plan shoot self.Bipolar/Depression,Anxiety. Currently denies SI/HI/AVH. Admission interview completed. Oriented to new surroundings. Safety checks initiated. Will continue to monitor. Patient is currently on Buprenorphine and scheduled to receive first dose this am.

## 2023-01-10 NOTE — Plan of Care (Signed)
  Problem: Safety: Goal: Periods of time without injury will increase Outcome: Progressing   

## 2023-01-10 NOTE — Group Note (Signed)
Date:  01/10/2023 Time:  9:46 PM  Group Topic/Focus:  Recovery Goals:   The focus of this group is to identify appropriate goals for recovery and establish a plan to achieve them.    Participation Level:  Minimal  Participation Quality:  Attentive  Affect:  Appropriate  Cognitive:  Appropriate  Insight: Limited  Engagement in Group:  Limited  Modes of Intervention:   n/a  Additional Comments:  n/a  Carlee Vonderhaar E Mj Willis 01/10/2023, 9:46 PM

## 2023-01-10 NOTE — [facility] Suicide Risk Assessment (Signed)
[facility] INPATIENT:  Family/Significant Other Suicide Prevention Education  Suicide Prevention Education:  Education Completed; Claiborne Rigg ( spouse) [phone],  (name of family member/significant other) has been identified by the patient as the family member/significant other with whom the patient will be residing, and identified as the person(s) who will aid the patient in the event of a mental health crisis (suicidal ideations/suicide attempt).  With written consent from the patient, the family member/significant other has been provided the following suicide prevention education, prior to the and/or following the discharge of the patient.   Safety planning was completed with spouse. Spouse stated that patient has been having SI thoughts and takes his medication consistently. Spouse states that her and mom does not have any safety concerns and that the guns will be removed from home tomorrow.    The suicide prevention education provided includes the following: Suicide risk factors Suicide prevention and interventions National Suicide Hotline telephone number [facility] assessment telephone number [facility] Emergency Assistance 911 [facility] and/or Residential Mobile Crisis Unit telephone number  Request made of family/significant other to: Remove weapons (e.g., guns, rifles, knives), all items previously/currently identified as safety concern.   Remove drugs/medications (over-the-counter, prescriptions, illicit drugs), all items previously/currently identified as a safety concern.  The family member/significant other verbalizes understanding of the suicide prevention education information provided.  The family member/significant other agrees to remove the items of safety concern listed above.  Isabella Bowens 01/10/2023, 3:41 PM

## 2023-01-10 NOTE — Progress Notes (Signed)
   01/10/23 0000  Psych Admission Type (Psych Patients Only)  Admission Status Voluntary  Psychosocial Assessment  Patient Complaints Agitation;Anger;Anxiety;Decreased concentration;Crying spells;Depression;Hopelessness;Irritability;Insomnia;Panic attack;Nervousness;Sadness;Self-harm thoughts;Worrying;Shakiness  Biomedical scientist  Facial Expression Anxious  Affect Anxious  Speech Logical/coherent  Interaction Assertive  Motor Activity Other (Comment) (wnl)  Appearance/Hygiene Unremarkable  Behavior Characteristics Cooperative  Mood Pleasant  Thought Process  Coherency WDL  Content WDL (none reported or observed)  Delusions None reported or observed  Perception WDL  Hallucination None reported or observed  Judgment WDL  Confusion None  Danger to Self  Current suicidal ideation? Denies  Agreement Not to Harm Self Yes  Description of Agreement verbal  Danger to Others  Danger to Others None reported or observed   Alert/oriented. Makes needs/concerns known to staff. Pleasant cooperative with staff. Denies SI/HI/A/V hallucinations. Med compliant. PRN med given with good effect. Patient states went to group. Will encourage compliance and progression towards goals. Verbally contracted for safety. Will continue to monitor.

## 2023-01-10 NOTE — Progress Notes (Signed)
Spoke with patient's wife; [name],  about picking up patient's buprenorphine.  Wife will be in tomorrow around 4pm for pick-up of medication.

## 2023-01-10 NOTE — [facility] IP Treatment Plan (Unsigned)
Interdisciplinary Treatment and Diagnostic Plan Update  01/10/2023 Time of Session: 10:30am [name] MRN: [id number]  Principal Diagnosis: MDD (major depressive disorder)  Secondary Diagnoses: Principal Problem:   MDD (major depressive disorder)   Current Medications:  Current Facility-Administered Medications  Medication Dose Route Frequency Provider Last Rate Last Admin   acetaminophen (TYLENOL) tablet 650 mg  650 mg Oral Q6H PRN Weber, Kyra A, NP       alum & mag hydroxide-simeth (MAALOX/MYLANTA) 200-200-20 MG/5ML suspension 30 mL  30 mL Oral Q4H PRN Weber, Kyra A, NP       amLODipine (NORVASC) tablet 10 mg  10 mg Oral Daily Bobbitt, Shalon E, NP   10 mg at 01/10/23 0754   atorvastatin (LIPITOR) tablet 10 mg  10 mg Oral Daily Bobbitt, Shalon E, NP   10 mg at 01/10/23 0754   buprenorphine-naloxone (SUBOXONE) 8-2 mg per SL tablet 1 tablet  1 tablet Sublingual Daily Bobbitt, Shalon E, NP   1 tablet at 01/10/23 4098   And   buprenorphine-naloxone (SUBOXONE) 2-0.5 mg per SL tablet 2 tablet  2 tablet Sublingual Daily Bobbitt, Shalon E, NP   2 tablet at 01/10/23 0916   cloNIDine (CATAPRES) tablet 0.2 mg  0.2 mg Oral BID Weber, Kyra A, NP   0.2 mg at 01/10/23 1191   diphenhydrAMINE (BENADRYL) capsule 50 mg  50 mg Oral TID PRN Phebe Colla A, NP       Or   diphenhydrAMINE (BENADRYL) injection 50 mg  50 mg Intramuscular TID PRN Weber, Bella Kennedy A, NP       haloperidol (HALDOL) tablet 5 mg  5 mg Oral TID PRN Weber, Kyra A, NP   5 mg at 01/09/23 2309   Or   haloperidol lactate (HALDOL) injection 5 mg  5 mg Intramuscular TID PRN Weber, Bella Kennedy A, NP       hydrOXYzine (ATARAX) tablet 50 mg  50 mg Oral TID PRN Weber, Kyra A, NP   50 mg at 01/09/23 2309   magnesium hydroxide (MILK OF MAGNESIA) suspension 30 mL  30 mL Oral Daily PRN Weber, Kyra A, NP       nitroGLYCERIN (NITROSTAT) SL tablet 0.4 mg  0.4 mg Sublingual Q5 min PRN Bobbitt, Shalon E, NP       traZODone (DESYREL) tablet 100 mg  100 mg  Oral QHS PRN Weber, Kyra A, NP   100 mg at 01/09/23 2309   PTA Medications: Medications Prior to Admission  Medication Sig Dispense Refill Last Dose   amLODipine (NORVASC) 10 MG tablet Take 1 tablet (10 mg total) by mouth daily. 90 tablet 3 01/09/2023   atorvastatin (LIPITOR) 10 MG tablet Take 1 tablet (10 mg total) by mouth daily. 90 tablet 3 01/09/2023   Buprenorphine HCl-Naloxone HCl 12-3 MG FILM Place 1 Film under the tongue 2 (two) times daily.   01/09/2023   cloNIDine (CATAPRES) 0.2 MG tablet Take 1 tablet (0.2 mg total) by mouth 2 (two) times daily. 180 tablet 3 01/09/2023   nitroGLYCERIN (NITROSTAT) 0.4 MG SL tablet PLACE 1 TABLET (0.4 MG TOTAL) UNDER THE TONGUE EVERY 5 (FIVE) MINUTES AS NEEDED FOR UP TO 25 DAYS FOR CHEST PAIN. 25 tablet 3 Past Week    Patient Stressors:    Patient Strengths:    Treatment Modalities: Medication Management, Group therapy, Case management,  1 to 1 session with clinician, Psychoeducation, Recreational therapy.   Physician Treatment Plan for Primary Diagnosis: MDD (major depressive disorder) Long Term Goal(s):  Short Term Goals:    Medication Management: Evaluate patient's response, side effects, and tolerance of medication regimen.  Therapeutic Interventions: 1 to 1 sessions, Unit Group sessions and Medication administration.  Evaluation of Outcomes: Progressing  Physician Treatment Plan for Secondary Diagnosis: Principal Problem:   MDD (major depressive disorder)  Long Term Goal(s):     Short Term Goals:       Medication Management: Evaluate patient's response, side effects, and tolerance of medication regimen.  Therapeutic Interventions: 1 to 1 sessions, Unit Group sessions and Medication administration.  Evaluation of Outcomes: Progressing   RN Treatment Plan for Primary Diagnosis: MDD (major depressive disorder) Long Term Goal(s): Knowledge of disease and therapeutic regimen to maintain health will improve  Short Term Goals:  Ability to remain free from injury will improve, Ability to verbalize frustration and anger appropriately will improve, Ability to demonstrate self-control, Ability to participate in decision making will improve, Ability to verbalize feelings will improve, Ability to disclose and discuss suicidal ideas, Ability to identify and develop effective coping behaviors will improve, and Compliance with prescribed medications will improve  Medication Management: RN will administer medications as ordered by provider, will assess and evaluate patient's response and provide education to patient for prescribed medication. RN will report any adverse and/or side effects to prescribing provider.  Therapeutic Interventions: 1 on 1 counseling sessions, Psychoeducation, Medication administration, Evaluate responses to treatment, Monitor vital signs and CBGs as ordered, Perform/monitor CIWA, COWS, AIMS and Fall Risk screenings as ordered, Perform wound care treatments as ordered.  Evaluation of Outcomes: Progressing   LCSW Treatment Plan for Primary Diagnosis: MDD (major depressive disorder) Long Term Goal(s): Safe transition to appropriate next level of care at discharge, Engage patient in therapeutic group addressing interpersonal concerns.  Short Term Goals: Engage patient in aftercare planning with referrals and resources, Increase social support, Increase ability to appropriately verbalize feelings, Increase emotional regulation, Facilitate acceptance of mental health diagnosis and concerns, Facilitate patient progression through stages of change regarding substance use diagnoses and concerns, and Identify triggers associated with mental health/substance abuse issues  Therapeutic Interventions: Assess for all discharge needs, 1 to 1 time with Social worker, Explore available resources and support systems, Assess for adequacy in community support network, Educate family and significant other(s) on suicide prevention,  Complete Psychosocial Assessment, Interpersonal group therapy.  Evaluation of Outcomes: Progressing   Progress in Treatment: Attending groups: Yes. Participating in groups: Yes. Taking medication as prescribed: Yes. Toleration medication: Yes. Family/Significant other contact made: Yes, individual(s) contacted:  Claiborne Rigg ( spouse) [phone] Patient understands diagnosis: Yes. Discussing patient identified problems/goals with staff: Yes. Medical problems stabilized or resolved: Yes. Denies suicidal/homicidal ideation: Yes. Issues/concerns per patient self-inventory: No.  New problem(s) identified: No, Describe:  none reported   New Short Term/Long Term Goal(s):  medication stabilization, elimination of SI thoughts, development of comprehensive mental wellness plan.    Patient Goals:  Pt states, "I need to get my head on right"  Discharge Plan or Barriers: Patient recently admitted. CSW will continue to follow and assess for appropriate referrals and possible discharge planning.    Reason for Continuation of Hospitalization: Anxiety Depression Medication stabilization Withdrawal symptoms  Estimated Length of Stay: 3-5 days  Last 3 [place] Suicide Severity Risk Score: Flowsheet Row Ad[facility] (Current[facility] Most recent reading at 01/09/2023 11:01 PM ED from 01/09/2023 in [facility] Emergency Department at [facility] Most recent reading at 01/09/2023 11:04 AM ED from 12/03/2022  in [facility] Emergency Department at [facility] Most recent reading at 12/03/2022  1:56 PM  C-SSRS RISK CATEGORY Low Risk High Risk No Risk       Last PHQ 2/9 Scores:     No data to display          Scribe for Treatment Team: Beatris Si, LCSW 01/10/2023 3:59 PM

## 2023-01-10 NOTE — Group Note (Signed)
Recreation Therapy Group Note   Group Topic:Healthy Decision Making  Group Date: 01/10/2023 Start Time: 0935 End Time: 1015 Facilitators: Noemy Hallmon-McCall, LRT,CTRS Location: [address] Dayroom   Goal Area(s) Addresses:  Patient will effectively work with peer towards shared goal.  Patient will identify factors that guided their decision making.  Patient will pro-socially communicate ideas during group session.   Group Descripiton:  Patients were given a scenario that they were going to be stranded on a deserted [place] for several months before being rescued. Writer tasked them with making a list of 15 things they would choose to bring with them for "survival". The list of items was prioritized most important to least. Each patient would come up with their own list, then work together to create a new list of 15 items while in a group of 3-5 peers. LRT discussed each person's list and how it differed from others. The debrief included discussion of priorities, good decisions versus bad decisions, and how it is important to think before acting so we can make the best decision possible. LRT tied the concept of effective communication among group members to patient's support systems outside of the hospital and its benefit post discharge.   Affect/Mood: N/A   Participation Level: Did not attend    Clinical Observations/Individualized Feedback:     Plan: Continue to engage patient in RT group sessions 2-3x/week.   Zaydn Gutridge-McCall, LRT,CTRS 01/10/2023 12:47 PM

## 2023-01-11 LAB — LIPID PANEL
Cholesterol: 139 mg/dL (ref 0–200)
HDL: 22 mg/dL — ABNORMAL LOW (ref >40)
LDL Cholesterol: 90 mg/dL (ref 0–99)
Total CHOL/HDL Ratio: 6.3 RATIO
Triglycerides: 133 mg/dL (ref <150)
VLDL: 27 mg/dL (ref 0–40)

## 2023-01-11 LAB — HEMOGLOBIN A1C
Hgb A1c MFr Bld: 5.6 % (ref 4.8–5.6)
Mean Plasma Glucose: 114.02 mg/dL

## 2023-01-11 LAB — T4, FREE: Free T4: 0.81 ng/dL (ref 0.61–1.12)

## 2023-01-11 LAB — TSH: TSH: 1.271 u[IU]/mL (ref 0.350–4.500)
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
## 2023-01-11 NOTE — Progress Notes (Signed)
   01/11/23 2022  Charting Type  Charting Type Shift assessment  Safety Check Verification  Has the RN verified the 15 minute safety check completion? Yes  Neurological  Neuro (WDL) WDL  HEENT  HEENT (WDL) WDL  Respiratory  Respiratory (WDL) WDL  Cardiac  Cardiac (WDL) X  Pulse  (MI)  Vascular  Vascular (WDL) X  Integumentary  Integumentary (WDL) WDL  Braden Scale (Ages 8 and up)  Sensory Perceptions 4  Moisture 4  Activity 4  Mobility 4  Nutrition 3  Friction and Shear 3  Braden Scale Score 22  Musculoskeletal  Musculoskeletal (WDL) WDL  Gastrointestinal  Gastrointestinal (WDL) WDL  GU Assessment  Genitourinary (WDL) WDL  Neurological  Level of Consciousness Alert   Alert/oriented. Makes needs/concerns known to staff. Pleasant cooperative with staff. Denies SI/HI/A/V hallucinations. Med compliant. Patient states went to group. Will encourage compliance and progression towards goals. Verbally contracted for safety. Will continue to monitor.

## 2023-01-11 NOTE — Group Note (Signed)
LCSW Group Therapy Note   Group Date: 01/11/2023 Start Time: 1100 End Time: 1200   Type of Therapy and Topic:  Group Therapy: Therapy Goals   Participation Level:  Active    Description of Group: In this process group, patients discussed using strengths to work toward goals and address challenges.  Patients identified two positive things about themselves and one goal they were working on.  Patients were given the opportunity to share openly and support each other's plan for self-empowerment.  The group discussed the value of gratitude and were encouraged to have a daily reflection of positive characteristics or circumstances.  Patients were encouraged to identify a plan to utilize their strengths to work on current challenges and goals.   Therapeutic Goals Patient will verbalize personal strengths/positive qualities and relate how these can assist with achieving desired personal goals Patients will verbalize affirmation of peers plans for personal change and goal setting Patients will explore the value of gratitude and positive focus as related to successful achievement of goals Patients will verbalize a plan for regular reinforcement of personal positive qualities and circumstances.   Summary of Patient Progress: Patient identified the definition of goals. Patients shared his insight of what he expected from his therapist since he has never had one before. Patient verbalized personal strength and how they relate to achieving the desired goal that can help him when dealing with his anxiety. Patient was able to identify positive goals to work towards when he returns home. Patient was respectful towards CSW and peers and remained in group the entire time.        Therapeutic Modalities Cognitive Behavioral Therapy Motivational Interviewing       Therapeutic Modalities:   Beather Arbour 01/11/2023  1:47 PM

## 2023-01-11 NOTE — Progress Notes (Signed)
   01/11/23 1000  Psych Admission Type (Psych Patients Only)  Admission Status Voluntary  Psychosocial Assessment  Patient Complaints Depression;Anxiety  Eye Contact Fair  Facial Expression Anxious  Affect Anxious  Speech Logical/coherent  Interaction Assertive  Motor Activity Other (Comment) (WNL)  Appearance/Hygiene Unremarkable  Behavior Characteristics Cooperative  Mood Pleasant  Thought Process  Coherency WDL  Content WDL  Delusions None reported or observed  Perception WDL  Hallucination None reported or observed  Judgment WDL  Confusion None  Danger to Self  Current suicidal ideation? Denies  Agreement Not to Harm Self Yes  Description of Agreement verbal  Danger to Others  Danger to Others None reported or observed

## 2023-01-11 NOTE — Plan of Care (Signed)
  Problem: Safety: Goal: Periods of time without injury will increase Outcome: Progressing   

## 2023-01-11 NOTE — Progress Notes (Signed)
Adult Psychoeducational Group Note  Date:  01/11/2023 Time:  11:13 PM  Group Topic/Focus:  Wrap-Up Group:   The focus of this group is to help patients review their daily goal of treatment and discuss progress on daily workbooks.  Participation Level:  Active  Participation Quality:  Appropriate  Affect:  Appropriate  Cognitive:  Appropriate  Insight: Appropriate  Engagement in Group:  Engaged  Modes of Intervention:  Discussion and Support  Additional Comments:  Pt states goal today, was to get through the day and get out of the room. Pt states something positive that happened today, was seeing mom. Pt rates day an 8.5/10. Pt states wanting to work on the same thing tomorrow.  Angelette Ganus Katrinka Blazing 01/11/2023, 11:13 PM

## 2023-01-11 NOTE — Group Note (Signed)
Recreation Therapy Group Note   Group Topic:Animal Assisted Therapy   Group Date: 01/11/2023 Start Time: 0945 End Time: 1030 Facilitators: Libby Goehring-McCall, LRT,CTRS Location: [address] Dayroom   Animal-Assisted Activity (AAA) Program Checklist/Progress Notes Patient Eligibility Criteria Checklist & Daily Group note for Rec Tx Intervention  AAA/T Program Assumption of Risk Form signed by Patient/ or Parent Legal Guardian Yes  Patient understands his/her participation is voluntary Yes   Affect/Mood: N/A   Participation Level: Did not attend    Clinical Observations/Individualized Feedback:     Plan: Continue to engage patient in RT group sessions 2-3x/week.   [name], LRT,CTRS 01/11/2023 1:17 PM

## 2023-01-12 NOTE — [facility] Group Notes (Signed)
Adult Psychoeducational Group Note  Date:  01/12/2023 Time:  4:53 PM  Group Topic/Focus:  Emotional Education:   The focus of this group is to discuss what feelings/emotions are, and how they are experienced.  Participation Level:  Active  Participation Quality:  Appropriate  Affect:  Appropriate  Cognitive:  Appropriate  Insight: Appropriate  Engagement in Group:  Engaged  Modes of Intervention:  Activity  Additional Comments:  Pt was able to listen to instructions on how to journal. Pt journaled for 5-10 minutes and participated in discussion with peers.   [name] 01/12/2023, 4:53 PM

## 2023-01-12 NOTE — Group Note (Signed)
Recreation Therapy Group Note   Group Topic:Leisure Education  Group Date: 01/12/2023 Start Time: 0930 End Time: 1000 Facilitators: Trevian Hayashida-McCall, LRT,CTRS Location: [address] Dayroom   Goal Area(s) Addresses:  Patient will successfully identify positive leisure and recreation activities.  Patient will acknowledge benefits of participation in healthy leisure activities post discharge.  Patient will actively work with peers toward a shared goal.    Group Description: Pictionary. In groups of 5-7, patients took turns trying to guess the picture being drawn on the board by their teammate.  If the team guessed the correct answer, they won a point.  If the team guessed wrong, the other team got a chance to steal the point. After several rounds of game play, the team with the most points were declared winners. Post-activity discussion reviewed benefits of positive recreation outlets: reducing stress, improving coping mechanisms, increasing self-esteem, and building larger support systems.    Clinical Observations/Individualized Feedback: Group unable to be facilitated due orientation group running into recreation therapy group time.   Plan: Continue to engage patient in RT group sessions 2-3x/week.   Camdon Saetern-McCall, LRT,CTRS 01/12/2023 2:22 PM

## 2023-01-12 NOTE — Group Note (Unsigned)
Date:  01/12/2023 Time:  10:18 AM  Group Topic/Focus:  Goals Group:   The focus of this group is to help patients establish daily goals to achieve during treatment and discuss how the patient can incorporate goal setting into their daily lives to aide in recovery.     Participation Level:  {BHH PARTICIPATION ZOXWR:60454}  Participation Quality:  {BHH PARTICIPATION QUALITY:22265}  Affect:  {BHH AFFECT:22266}  Cognitive:  {BHH COGNITIVE:22267}  Insight: {BHH Insight2:20797}  Engagement in Group:  {BHH ENGAGEMENT IN UJWJX:91478}  Modes of Intervention:  {BHH MODES OF INTERVENTION:22269}  Additional Comments:  ***  Hoke Baer T Amira Podolak 01/12/2023, 10:18 AM

## 2023-01-12 NOTE — Progress Notes (Signed)
   01/12/23 2011  Charting Type  Charting Type Shift assessment  Safety Check Verification  Has the RN verified the 15 minute safety check completion? Yes  Neurological  Neuro (WDL) WDL  HEENT  HEENT (WDL) WDL  Respiratory  Respiratory (WDL) WDL  Cardiac  Cardiac (WDL) X  Pulse  (MI)  Vascular  Vascular (WDL) X  Integumentary  Integumentary (WDL) WDL  Braden Scale (Ages 8 and up)  Sensory Perceptions 4  Moisture 4  Activity 4  Mobility 4  Nutrition 3  Friction and Shear 3  Braden Scale Score 22  Musculoskeletal  Musculoskeletal (WDL) WDL  Gastrointestinal  Gastrointestinal (WDL) WDL  GU Assessment  Genitourinary (WDL) WDL  Neurological  Level of Consciousness Alert   Alert/oriented. Makes needs/concerns known to staff. Pleasant cooperative with staff. Denies SI/HI/A/V hallucinations. Med compliant. PRN med given with good effect. Patient states went to group. Will encourage compliance and progression towards goals. Verbally contracted for safety. Will continue to monitor.

## 2023-01-12 NOTE — Group Note (Signed)
Date:  01/12/2023 Time:  10:16 PM  Group Topic/Focus:  NA/WRAP Up and Wind Down Group    Participation Level:  Active  Participation Quality:  Attentive  Affect:  Appropriate  Cognitive:  Appropriate  Insight: Appropriate  Engagement in Group:  Engaged  Modes of Intervention:  Education and Support  Additional Comments:   Pt attended the NA/Wrap Up and Wind Down group.  [name] 01/12/2023, 10:16 PM
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
## 2023-01-12 NOTE — Progress Notes (Signed)
    01/12/23 0900  Psych Admission Type (Psych Patients Only)  Admission Status Voluntary  Psychosocial Assessment  Patient Complaints Anxiety;Depression  Eye Contact Fair  Facial Expression Anxious  Affect Anxious  Speech Logical/coherent  Interaction Assertive  Motor Activity Other (Comment) (wnl)  Appearance/Hygiene Unremarkable  Behavior Characteristics Cooperative  Mood Pleasant  Thought Process  Coherency WDL  Content WDL  Delusions None reported or observed  Perception WDL  Hallucination None reported or observed  Judgment WDL  Confusion None  Danger to Self  Current suicidal ideation? Denies  Agreement Not to Harm Self Yes  Description of Agreement verbal  Danger to Others  Danger to Others None reported or observed

## 2023-01-13 DIAGNOSIS — F1994 Other psychoactive substance use, unspecified with psychoactive substance-induced mood disorder: Principal | ICD-10-CM

## 2023-01-13 MED ORDER — ADULT MULTIVITAMIN W/MINERALS CH: 1.0000 | ORAL_TABLET | Freq: Every day | ORAL | 0 refills | Status: AC

## 2023-01-13 MED ORDER — FLUOXETINE HCL 20 MG PO CAPS: 20.0000 mg | ORAL_CAPSULE | Freq: Every day | ORAL | 0 refills | Status: DC

## 2023-01-13 MED ORDER — GABAPENTIN 300 MG PO CAPS: 300.0000 mg | ORAL_CAPSULE | Freq: Three times a day (TID) | ORAL | 0 refills | Status: DC

## 2023-01-13 MED ORDER — VITAMIN B-1 100 MG PO TABS: 100.0000 mg | ORAL_TABLET | Freq: Every day | ORAL | 0 refills | Status: AC
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
## 2023-01-13 NOTE — Hospital Course (Addendum)
Hospital Course:   During the patient's hospitalization, patient had extensive initial psychiatric evaluation, and follow-up psychiatric evaluations every day.  Psychiatric diagnoses provided upon initial assessment:  Principal Problem:   Substance induced mood disorder (HCC) Active Problems:   Tobacco use   Cannabis use disorder   Alcohol use disorder in remission   Severe benzodiazepine use disorder (HCC)   Chronic, continuous use of opioids  Patient's psychiatric medications were adjusted on admission:  STARTED prozac 20 mg daily DISCONTINUED Wellbutrin 150 mg daily STARTED gabapentin 300 mg TID STARTED CIWA with PRNs, thiamine, MV per protocol Continued home suboxone Continued home clonidine PO Continued home amlodipine 10 mg daily, lipitor 10 mg daily   During the hospitalization, other adjustments were made to the patient's psychiatric medication regimen:  Continued prozac 20 mg daily Continued gabapentin 300 mg TID CIWA with PRNs, thiamine, MV per protocol  Continued home suboxone Continued home clonidine PO Continued home amlodipine 10 mg daily, lipitor 10 mg daily   During the hospitalization lab/imaging obtained: BMI:38.22  CMP showed AST 48, hyperglycemia, otherwise WNL CBC WNL Tylenol level <10  Salicylate level 11 UDS + bzo, thc BAL <10 Qtc 433  Patient's care was discussed during the interdisciplinary team meeting every day during the hospitalization.  Patient's side effects to prescribed psychiatric medications: NA  Assessment  Gradually, patient started adjusting to milieu. The patient was evaluated each day by a clinical provider to ascertain response to treatment. Improvement was noted by the patient's report of decreasing symptoms, improved sleep and appetite, affect, medication tolerance, behavior, and participation in unit programming.  Patient was asked each day to complete a self inventory noting mood, mental status, pain, new symptoms, anxiety  and concerns.    Symptoms were reported as significantly decreased or resolved completely by discharge.   On day of discharge, the patient reports that their mood is stable. The patient denied having suicidal thoughts for more than 48 hours prior to discharge.  Patient denies having homicidal thoughts.  Patient denies having auditory hallucinations.  Patient denies any visual hallucinations or other symptoms of psychosis. The patient was motivated to continue taking medication with a goal of continued improvement in mental health.   The patient reports their target psychiatric symptoms of suicidal ideation responded well to the psychiatric medications, and the patient reports overall benefit other psychiatric hospitalization. Supportive psychotherapy was provided to the patient. The patient also participated in regular group therapy while hospitalized. Coping skills, problem solving as well as relaxation therapies were also part of the unit programming.  Labs were reviewed with the patient, and abnormal results were discussed with the patient.  The patient is able to verbalize their individual safety plan to this provider.  Behavioral Events: NA  Restraints: NA  Groups: Engaged, appropriate

## 2023-01-13 NOTE — Progress Notes (Signed)
Patient is discharging at this time. Patient is A&Ox4. VS Stable. Patient denies SI,HI, and A/V/H with no plan/intent. Printed AVS reviewed with and given to patient along with medications and follow up appointments. Suicide safety plan complete with copy provided to patient. Original form in chart. Patient verbalized all understanding. All valuables/belongings returned to patient. Patient is being transported by his mother . Patient denies any pain/discomfort. No s/s of current distress.

## 2023-01-13 NOTE — BHH Suicide Risk Assessment (Signed)
[facility] Discharge Suicide Risk Assessment  Principal Problem: Substance induced mood disorder (HCC) Discharge Diagnoses: Principal Problem:   Substance induced mood disorder (HCC) Active Problems:   Tobacco use   Cannabis use disorder   Alcohol use disorder in remission   Severe benzodiazepine use disorder (HCC)   Chronic, continuous use of opioids   Reason for admission:  [name] is a 47 y.o. male with PMH of bipolar affective d/o v substance induced mood d/o, GAD, remote suicide attempt, inpatient psych admission, who presented to [facility] (01/09/2023), the admitted to [facility] the same day for suicidal ideation with a plan to shoot himself.   PTA Medications:  Lipitor 10 mg, amlodipine 10 mg, Suboxone 12-3 mg BID, nitroglycerine 0.4 mg  PRN, clonidine 0.2 mg BID Was on zoloft 200 mg in 09/2022, has not filled it since.  Hospital Course:   During the patient's hospitalization, patient had extensive initial psychiatric evaluation, and follow-up psychiatric evaluations every day.  Psychiatric diagnoses provided upon initial assessment:  Principal Problem:   Substance induced mood disorder (HCC) Active Problems:   Tobacco use   Cannabis use disorder   Alcohol use disorder in remission   Severe benzodiazepine use disorder (HCC)   Chronic, continuous use of opioids  Patient's psychiatric medications were adjusted on admission:  STARTED prozac 20 mg daily DISCONTINUED Wellbutrin 150 mg daily STARTED gabapentin 300 mg TID STARTED CIWA with PRNs, thiamine, MV per protocol Continued home suboxone Continued home clonidine PO Continued home amlodipine 10 mg daily, lipitor 10 mg daily   During the hospitalization, other adjustments were made to the patient's psychiatric medication regimen:  Continued prozac 20 mg daily Continued gabapentin 300 mg TID CIWA with PRNs, thiamine, MV per protocol  Continued home suboxone Continued home clonidine PO Continued home amlodipine 10 mg  daily, lipitor 10 mg daily   During the hospitalization lab/imaging obtained: BMI:38.22  CMP showed AST 48, hyperglycemia, otherwise WNL CBC WNL Tylenol level <10  Salicylate level 11 UDS + bzo, thc BAL <10 Qtc 433  Patient's care was discussed during the interdisciplinary team meeting every day during the hospitalization.  Patient's side effects to prescribed psychiatric medications: NA  Assessment  Gradually, patient started adjusting to milieu. The patient was evaluated each day by a clinical provider to ascertain response to treatment. Improvement was noted by the patient's report of decreasing symptoms, improved sleep and appetite, affect, medication tolerance, behavior, and participation in unit programming.  Patient was asked each day to complete a self inventory noting mood, mental status, pain, new symptoms, anxiety and concerns.    Symptoms were reported as significantly decreased or resolved completely by discharge.   On day of discharge, the patient reports that their mood is stable. The patient denied having suicidal thoughts for more than 48 hours prior to discharge.  Patient denies having homicidal thoughts.  Patient denies having auditory hallucinations.  Patient denies any visual hallucinations or other symptoms of psychosis. The patient was motivated to continue taking medication with a goal of continued improvement in mental health.   The patient reports their target psychiatric symptoms of suicidal ideation responded well to the psychiatric medications, and the patient reports overall benefit other psychiatric hospitalization. Supportive psychotherapy was provided to the patient. The patient also participated in regular group therapy while hospitalized. Coping skills, problem solving as well as relaxation therapies were also part of the unit programming.  Labs were reviewed with the patient, and abnormal results were discussed with the patient.  The patient is able to  verbalize their individual safety plan to this provider.  Behavioral Events: NA  Restraints: NA  Groups: Engaged, appropriate     Medication List    START taking these medications    FLUoxetine 20 MG capsule; Commonly known as: PROZAC; Take 1 capsule (20  mg total) by mouth daily.  gabapentin 300 MG capsule; Commonly known as: NEURONTIN; Take 1 capsule  (300 mg total) by mouth 3 (three) times daily.  multivitamin with minerals Tabs tablet; Take 1 tablet by mouth daily.  thiamine 100 MG tablet; Commonly known as: Vitamin B-1; Take 1 tablet  (100 mg total) by mouth daily.   CONTINUE taking these medications    amLODipine 10 MG tablet; Commonly known as: NORVASC; Take 1 tablet (10  mg total) by mouth daily.  atorvastatin 10 MG tablet; Commonly known as: LIPITOR; Take 1 tablet (10  mg total) by mouth daily.  Buprenorphine HCl-Naloxone HCl 12-3 MG Film  cloNIDine 0.2 MG tablet; Commonly known as: CATAPRES; Take 1 tablet (0.2  mg total) by mouth 2 (two) times daily.  nitroGLYCERIN 0.4 MG SL tablet; Commonly known as: NITROSTAT; PLACE 1  TABLET (0.4 MG TOTAL) UNDER THE TONGUE EVERY 5 (FIVE) MINUTES AS NEEDED  FOR UP TO 25 DAYS FOR CHEST PAIN.     Musculoskeletal: Strength & Muscle Tone: within normal limits Gait & Station: normal Patient leans: N/A   Presentation  General Appearance:Appropriate for Environment, Casual, Fairly Groomed Eye Contact:Good Speech:Clear and Coherent, Normal Rate Volume:Normal Handedness:Right  Mood and Affect  Mood:Euthymic Affect:Appropriate, Congruent, Full Range  Thought Process  Thought Process:Coherent, Goal Directed, Linear Descriptions of Associations:Intact  Thought Content Suicidal Thoughts:Suicidal Thoughts: No Homicidal Thoughts:Homicidal Thoughts: No Hallucinations:Hallucinations: None Ideas of Reference:None Thought Content:Logical, WDL  Sensorium  Memory:Immediate Good Judgment:Fair Insight:Fair  Executive Functions   Orientation:Full (Time, Place and Person) Language:Good Concentration:Good Attention:Good Recall:Good Fund of Knowledge:Good  Psychomotor Activity  Psychomotor Activity:Psychomotor Activity: Normal  Assets  Assets:Communication Skills, Desire for Improvement, Resilience, Social Support, Housing, Intimacy, Leisure Time, Vocational/Educational, Transportation, Talents/Skills  Sleep  Quality:Good  Physical Exam Vitals and nursing note reviewed.  Constitutional:      General: He is not in acute distress.    Appearance: He is not ill-appearing, toxic-appearing or diaphoretic.  HENT:     Head: Normocephalic.  Pulmonary:     Effort: Pulmonary effort is normal. No respiratory distress.  Neurological:     Mental Status: He is alert.     Review of Systems  Respiratory:  Negative for shortness of breath.   Cardiovascular:  Negative for chest pain.  Gastrointestinal:  Negative for nausea and vomiting.  Musculoskeletal:  Positive for back pain.  Neurological:  Negative for dizziness and headaches.     Blood pressure 111/72, pulse 66, temperature 97.7 F (36.5 C), temperature source Oral, resp. rate 20, height 5\' 10"  (1.778 m), weight 120.8 kg, SpO2 99 %. Body mass index is 38.22 kg/m.  Mental Status Per Nursing Assessment::   On Admission:  NA  Demographic Factors: Male  Loss Factors: Decline in physical health  Historical Factors: Impulsivity  Risk Reduction Factors:   Sense of responsibility to family, Living with another person, especially a relative, Positive social support, Positive therapeutic relationship, and Positive coping skills or problem solving skills  Continued Clinical Symptoms:  Alcohol/Substance Abuse/Dependencies More than one psychiatric diagnosis Previous Psychiatric Diagnoses and Treatments Medical Diagnoses and Treatments/Surgeries  Cognitive Features That Contribute To Risk:  None    Suicide Risk:  Mild: There are no identifiable suicide  plans, no associated intent, mild dysphoria and related symptoms, good self-control (both objective and subjective assessment), few other risk factors, and identifiable protective factors, including available and accessible social support    Follow-up Information     [facility] Go on 02/01/2023.   Why: You have an appointment for medication management services on  02/01/23 at 11:00 am.  This appointment will be held in person. Contact information: [address] [place] [address] [phone]         [facility]. Schedule an appointment as soon as possible for a visit.   Why: At provider request, please call personally to begin the process of scheduling an appointment for therapy services. Intake documents must be completed prior to scheduling an appointment. Contact information: [address] [place], [place] [address] [phone]                 Discharge recommendations:    # It is recommended to the patient to continue psychiatric medications as prescribed, after discharge from the hospital.     # It is recommended to the patient to follow up with your outpatient psychiatric provider -instructions on appointment date, time, and address (location) are provided to you in discharge paperwork  # Follow-up with outpatient primary care doctor and other specialists -for management of chronic medical disease, including:  HTN, HLD, back pain, benzodiazepine misuse   # Testing: Follow-up with outpatient provider for abnormal lab results:  See above   # It was discussed with the patient, the impact of alcohol, drugs, tobacco have been there overall psychiatric and medical wellbeing, and total abstinence from substance use was recommended to the patient.   # Prescriptions provided or sent directly to preferred pharmacy at discharge. Patient agreeable to plan. Given opportunity to ask questions. Appears to  feel comfortable with discharge.    # In the event of worsening symptoms, the patient is instructed to call the crisis hotline, 911 and or go to the nearest ED for appropriate evaluation and treatment of symptoms. To follow-up with primary care provider for other medical issues, concerns and or health care needs  Patient agrees with D/C instructions and plan.   Total Time Spent in Direct Patient Care: See attending attestation.  Signed: Princess Bruins, DO Psychiatry Resident, PGY-2 [facility]  [address] [place], [place] [address] Ph: [phone] Fax: [phone] 01/13/2023, 8:43 AM

## 2023-01-13 NOTE — Progress Notes (Signed)
  [facility] Adult Case Management Discharge Plan :  Will you be returning to the same living situation after discharge:  Yes,  Patient will be returning back to his home with mom and wife  At discharge, do you have transportation home?: Yes,  Wife or mom will be here at 11 AM  Do you have the ability to pay for your medications: Yes,  [employer]   Release of information consent forms completed and in the chart;  Patient's signature needed at discharge.  Patient to Follow up at:  Follow-up Information     [facility] Go on 02/01/2023.   Why: You have an appointment for medication management services on  02/01/23 at 11:00 am.  This appointment will be held in person. Contact information: [address] [place] [address] [phone]         [facility]. Schedule an appointment as soon as possible for a visit.   Why: At provider request, please call personally to begin the process of scheduling an appointment for therapy services. Intake documents must be completed prior to scheduling an appointment. Contact information: [address] [place], [place] [address] [phone]                 Next level of care provider has access to [facility] Link:no  Safety Planning and Suicide Prevention discussed: Yes,  Claiborne Rigg ( spouse) [phone]     Has patient been referred to the Quitline?: Patient refused referral for treatment. Patient has not smoked since 2022  Patient has been referred for addiction treatment: No known substance use disorder.  Isabella Bowens, LCSWA 01/13/2023, 9:24 AM

## 2023-01-15 DIAGNOSIS — F419 Anxiety disorder, unspecified: Secondary | ICD-10-CM | POA: Diagnosis not present

## 2023-01-15 DIAGNOSIS — F1120 Opioid dependence, uncomplicated: Secondary | ICD-10-CM | POA: Diagnosis not present

## 2023-01-19 DIAGNOSIS — F1120 Opioid dependence, uncomplicated: Secondary | ICD-10-CM | POA: Diagnosis not present

## 2023-01-28 ENCOUNTER — Emergency Department (HOSPITAL_COMMUNITY)
Admission: EM | Admit: 2023-01-28 | Discharge: 2023-01-28 | Disposition: A | Discharge status: Home / Self Care | Payer: 59 | Attending: Emergency Medicine | Admitting: Emergency Medicine

## 2023-01-28 ENCOUNTER — Other Ambulatory Visit: Payer: Self-pay

## 2023-01-28 ENCOUNTER — Emergency Department (HOSPITAL_COMMUNITY): Payer: 59

## 2023-01-28 ENCOUNTER — Encounter (HOSPITAL_COMMUNITY): Payer: Self-pay | Admitting: *Deleted

## 2023-01-28 DIAGNOSIS — I499 Cardiac arrhythmia, unspecified: Secondary | ICD-10-CM | POA: Diagnosis not present

## 2023-01-28 DIAGNOSIS — R5383 Other fatigue: Secondary | ICD-10-CM | POA: Insufficient documentation

## 2023-01-28 DIAGNOSIS — Z79899 Other long term (current) drug therapy: Secondary | ICD-10-CM | POA: Diagnosis not present

## 2023-01-28 DIAGNOSIS — I213 ST elevation (STEMI) myocardial infarction of unspecified site: Secondary | ICD-10-CM | POA: Diagnosis not present

## 2023-01-28 DIAGNOSIS — R001 Bradycardia, unspecified: Secondary | ICD-10-CM | POA: Insufficient documentation

## 2023-01-28 DIAGNOSIS — R9431 Abnormal electrocardiogram [ECG] [EKG]: Secondary | ICD-10-CM | POA: Diagnosis not present

## 2023-01-28 DIAGNOSIS — R600 Localized edema: Secondary | ICD-10-CM | POA: Diagnosis not present

## 2023-01-28 LAB — COMPREHENSIVE METABOLIC PANEL
ALT: 40 U/L (ref 0–44)
AST: 35 U/L (ref 15–41)
Albumin: 4 g/dL (ref 3.5–5.0)
Alkaline Phosphatase: 69 U/L (ref 38–126)
Anion gap: 15 (ref 5–15)
BUN: 14 mg/dL (ref 6–20)
CO2: 24 mmol/L (ref 22–32)
Calcium: 9.2 mg/dL (ref 8.9–10.3)
Chloride: 99 mmol/L (ref 98–111)
Creatinine, Ser: 1.27 mg/dL — ABNORMAL HIGH (ref 0.61–1.24)
GFR, Estimated: >60 mL/min (ref >60)
Glucose, Bld: 111 mg/dL — ABNORMAL HIGH (ref 70–99)
Potassium: 3.9 mmol/L (ref 3.5–5.1)
Sodium: 138 mmol/L (ref 135–145)
Total Bilirubin: 0.5 mg/dL (ref 0.3–1.2)
Total Protein: 7 g/dL (ref 6.5–8.1)

## 2023-01-28 LAB — AMMONIA: Ammonia: 25 umol/L (ref 9–35)

## 2023-01-28 LAB — CBC WITH DIFFERENTIAL/PLATELET
Abs Immature Granulocytes: 0.03 10*3/uL (ref 0.00–0.07)
Basophils Absolute: 0 10*3/uL (ref 0.0–0.1)
Basophils Relative: 0 %
Eosinophils Absolute: 0.3 10*3/uL (ref 0.0–0.5)
Eosinophils Relative: 3 %
HCT: 43.4 % (ref 39.0–52.0)
Hemoglobin: 14.9 g/dL (ref 13.0–17.0)
Immature Granulocytes: 0 %
Lymphocytes Relative: 32 %
Lymphs Abs: 3 10*3/uL (ref 0.7–4.0)
MCH: 28.8 pg (ref 26.0–34.0)
MCHC: 34.3 g/dL (ref 30.0–36.0)
MCV: 83.8 fL (ref 80.0–100.0)
Monocytes Absolute: 0.5 10*3/uL (ref 0.1–1.0)
Monocytes Relative: 5 %
Neutro Abs: 5.7 10*3/uL (ref 1.7–7.7)
Neutrophils Relative %: 60 %
Platelets: 237 10*3/uL (ref 150–400)
RBC: 5.18 MIL/uL (ref 4.22–5.81)
RDW: 12.7 % (ref 11.5–15.5)
WBC: 9.5 10*3/uL (ref 4.0–10.5)
nRBC: 0 % (ref 0.0–0.2)

## 2023-01-28 LAB — BRAIN NATRIURETIC PEPTIDE: B Natriuretic Peptide: 35.3 pg/mL (ref 0.0–100.0)

## 2023-01-28 LAB — MAGNESIUM: Magnesium: 2 mg/dL (ref 1.7–2.4)

## 2023-01-28 LAB — TSH: TSH: 2.15 u[IU]/mL (ref 0.350–4.500)

## 2023-01-28 LAB — TROPONIN I (HIGH SENSITIVITY)
Troponin I (High Sensitivity): 6 ng/L (ref <18)
Troponin I (High Sensitivity): 5 ng/L (ref <18)

## 2023-01-28 MED ORDER — SODIUM CHLORIDE 0.9 % IV BOLUS
1000.0000 mL | Freq: Once | INTRAVENOUS | Status: AC
  Administered 2023-01-28: 1000 mL via INTRAVENOUS
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
## 2023-01-28 NOTE — ED Triage Notes (Addendum)
Here with family by POV from home for difficult to arouse from nap, EMS called, HR was 42, declined transport, here for same, denies sx. HR now 47. Mentions mild head pressure, and heavy on posterior neck. Denies fall/ injury. Alert, NAD, calm, interactive, steady gait.

## 2023-01-28 NOTE — Discharge Instructions (Signed)
Your history, exam, and evaluation today are consistent with fatigue and slow heart rate like related to the combination of new medicines you recently started on.  The combination of the Prozac, gabapentin, and the beta-blocker Toprol likely contribute to the difficulty to be woken up today and the general fatigue and slow heart rate.  You have proven stability for over 5 hours here and your labs only showed some slight increase in kidney function likely due to some dehydration.  After fluids your blood pressure was reassuring and your heart rate had improved into the 50s.  We feel you are safe for discharge home.  We discussed consultation with cardiology but given your lack of any chest pain, palpitations, shortness of breath, or syncope, we agree with holding on calling them and having you call your cardiology team tomorrow.  We recommend holding the beta-blocker Toprol tonight and discussed with them tomorrow if they would like to continue this medication as I suspect it may have contributed to your slow heart rate.  Please rest and stay hydrated and call your primary doctor to discuss other medication changes as well.  If any symptoms change or worsen acutely, please return to the nearest emergency department.

## 2023-01-31 DIAGNOSIS — I519 Heart disease, unspecified: Secondary | ICD-10-CM | POA: Diagnosis not present

## 2023-01-31 DIAGNOSIS — F1120 Opioid dependence, uncomplicated: Secondary | ICD-10-CM | POA: Diagnosis not present

## 2023-02-16 ENCOUNTER — Encounter (HOSPITAL_COMMUNITY): Payer: Self-pay

## 2023-02-16 ENCOUNTER — Other Ambulatory Visit: Payer: Self-pay

## 2023-02-16 ENCOUNTER — Emergency Department (HOSPITAL_COMMUNITY): Payer: 59

## 2023-02-16 ENCOUNTER — Observation Stay (HOSPITAL_COMMUNITY): Payer: 59

## 2023-02-16 ENCOUNTER — Observation Stay (HOSPITAL_COMMUNITY)
Admission: EM | Admit: 2023-02-16 | Discharge: 2023-02-17 | Disposition: A | Discharge status: Home / Self Care | Payer: 59 | Attending: Infectious Diseases | Admitting: Infectious Diseases

## 2023-02-16 DIAGNOSIS — I10 Essential (primary) hypertension: Secondary | ICD-10-CM | POA: Diagnosis not present

## 2023-02-16 DIAGNOSIS — I7121 Aneurysm of the ascending aorta, without rupture: Secondary | ICD-10-CM

## 2023-02-16 DIAGNOSIS — D735 Infarction of spleen: Principal | ICD-10-CM | POA: Diagnosis present

## 2023-02-16 DIAGNOSIS — I2510 Atherosclerotic heart disease of native coronary artery without angina pectoris: Secondary | ICD-10-CM | POA: Insufficient documentation

## 2023-02-16 DIAGNOSIS — Z87891 Personal history of nicotine dependence: Secondary | ICD-10-CM

## 2023-02-16 DIAGNOSIS — I350 Nonrheumatic aortic (valve) stenosis: Secondary | ICD-10-CM

## 2023-02-16 DIAGNOSIS — Z7982 Long term (current) use of aspirin: Secondary | ICD-10-CM | POA: Diagnosis not present

## 2023-02-16 DIAGNOSIS — R112 Nausea with vomiting, unspecified: Secondary | ICD-10-CM

## 2023-02-16 DIAGNOSIS — R079 Chest pain, unspecified: Secondary | ICD-10-CM | POA: Diagnosis present

## 2023-02-16 DIAGNOSIS — Q2381 Bicuspid aortic valve: Secondary | ICD-10-CM

## 2023-02-16 DIAGNOSIS — R072 Precordial pain: Secondary | ICD-10-CM

## 2023-02-16 DIAGNOSIS — Z79899 Other long term (current) drug therapy: Secondary | ICD-10-CM | POA: Diagnosis not present

## 2023-02-16 DIAGNOSIS — Z23 Encounter for immunization: Secondary | ICD-10-CM | POA: Diagnosis not present

## 2023-02-16 DIAGNOSIS — Q231 Congenital insufficiency of aortic valve: Secondary | ICD-10-CM

## 2023-02-16 DIAGNOSIS — R9389 Abnormal findings on diagnostic imaging of other specified body structures: Secondary | ICD-10-CM

## 2023-02-16 LAB — ECHOCARDIOGRAM COMPLETE BUBBLE STUDY
AR max vel: 1.41 cm2
AV Area VTI: 1.55 cm2
AV Area mean vel: 1.43 cm2
AV Mean grad: 24 mmHg
AV Peak grad: 44.9 mmHg
Ao pk vel: 3.35 m/s
Area-P 1/2: 2.69 cm2
P 1/2 time: 524 msec
S' Lateral: 3.2 cm

## 2023-02-16 LAB — HEPATIC FUNCTION PANEL
ALT: 58 U/L — ABNORMAL HIGH (ref 0–44)
AST: 46 U/L — ABNORMAL HIGH (ref 15–41)
Albumin: 4.2 g/dL (ref 3.5–5.0)
Alkaline Phosphatase: 89 U/L (ref 38–126)
Bilirubin, Direct: 0.2 mg/dL (ref 0.0–0.2)
Indirect Bilirubin: 0.4 mg/dL (ref 0.3–0.9)
Total Bilirubin: 0.6 mg/dL (ref 0.3–1.2)
Total Protein: 7.5 g/dL (ref 6.5–8.1)

## 2023-02-16 LAB — CBC
HCT: 46.2 % (ref 39.0–52.0)
Hemoglobin: 16.2 g/dL (ref 13.0–17.0)
MCH: 28.6 pg (ref 26.0–34.0)
MCHC: 35.1 g/dL (ref 30.0–36.0)
MCV: 81.6 fL (ref 80.0–100.0)
Platelets: 262 10*3/uL (ref 150–400)
RBC: 5.66 MIL/uL (ref 4.22–5.81)
RDW: 12.8 % (ref 11.5–15.5)
WBC: 13.4 10*3/uL — ABNORMAL HIGH (ref 4.0–10.5)
nRBC: 0 % (ref 0.0–0.2)

## 2023-02-16 LAB — PROTIME-INR
INR: 1.1 (ref 0.8–1.2)
Prothrombin Time: 14.1 seconds (ref 11.4–15.2)

## 2023-02-16 LAB — BASIC METABOLIC PANEL
Anion gap: 16 — ABNORMAL HIGH (ref 5–15)
BUN: 14 mg/dL (ref 6–20)
CO2: 21 mmol/L — ABNORMAL LOW (ref 22–32)
Calcium: 9.3 mg/dL (ref 8.9–10.3)
Chloride: 101 mmol/L (ref 98–111)
Creatinine, Ser: 0.88 mg/dL (ref 0.61–1.24)
GFR, Estimated: >60 mL/min (ref >60)
Glucose, Bld: 207 mg/dL — ABNORMAL HIGH (ref 70–99)
Potassium: 3.4 mmol/L — ABNORMAL LOW (ref 3.5–5.1)
Sodium: 138 mmol/L (ref 135–145)

## 2023-02-16 LAB — RAPID URINE DRUG SCREEN, HOSP PERFORMED
Amphetamines: NOT DETECTED
Barbiturates: NOT DETECTED
Benzodiazepines: NOT DETECTED
Cocaine: NOT DETECTED
Opiates: NOT DETECTED
Tetrahydrocannabinol: POSITIVE — AB

## 2023-02-16 LAB — TROPONIN I (HIGH SENSITIVITY)
Troponin I (High Sensitivity): 7 ng/L (ref <18)
Troponin I (High Sensitivity): 6 ng/L (ref <18)

## 2023-02-16 LAB — LACTATE DEHYDROGENASE: LDH: 167 U/L (ref 98–192)

## 2023-02-16 LAB — HIV ANTIBODY (ROUTINE TESTING W REFLEX): HIV Screen 4th Generation wRfx: NONREACTIVE

## 2023-02-16 LAB — LIPASE, BLOOD: Lipase: 32 U/L (ref 11–51)

## 2023-02-16 MED ORDER — ACETAMINOPHEN 650 MG RE SUPP: 650.0000 mg | Freq: Four times a day (QID) | RECTAL | Status: DC | PRN

## 2023-02-16 MED ORDER — ENOXAPARIN SODIUM 40 MG/0.4ML IJ SOSY
40.0000 mg | PREFILLED_SYRINGE | INTRAMUSCULAR | Status: DC
  Administered 2023-02-16: 40 mg via SUBCUTANEOUS
  Filled 2023-02-16: qty 0.4

## 2023-02-16 MED ORDER — SENNOSIDES-DOCUSATE SODIUM 8.6-50 MG PO TABS: 1.0000 | ORAL_TABLET | Freq: Every evening | ORAL | Status: DC | PRN

## 2023-02-16 MED ORDER — METOPROLOL SUCCINATE ER 25 MG PO TB24
25.0000 mg | ORAL_TABLET | Freq: Every day | ORAL | Status: DC
  Administered 2023-02-17: 25 mg via ORAL
  Filled 2023-02-16: qty 1

## 2023-02-16 MED ORDER — LOSARTAN POTASSIUM 50 MG PO TABS
50.0000 mg | ORAL_TABLET | Freq: Every day | ORAL | Status: DC
  Administered 2023-02-16: 50 mg via ORAL
  Filled 2023-02-16: qty 1

## 2023-02-16 MED ORDER — BUPRENORPHINE HCL-NALOXONE HCL 2-0.5 MG SL SUBL
2.0000 | SUBLINGUAL_TABLET | Freq: Every day | SUBLINGUAL | Status: DC
  Administered 2023-02-16 – 2023-02-17 (×2): 2 via SUBLINGUAL
  Filled 2023-02-16 (×2): qty 2

## 2023-02-16 MED ORDER — FLUOXETINE HCL 20 MG PO CAPS
20.0000 mg | ORAL_CAPSULE | Freq: Every day | ORAL | Status: DC
  Administered 2023-02-16 – 2023-02-17 (×2): 20 mg via ORAL
  Filled 2023-02-16 (×2): qty 1

## 2023-02-16 MED ORDER — ACETAMINOPHEN 325 MG PO TABS: 650.0000 mg | ORAL_TABLET | Freq: Four times a day (QID) | ORAL | Status: DC | PRN

## 2023-02-16 MED ORDER — ALUM & MAG HYDROXIDE-SIMETH 200-200-20 MG/5ML PO SUSP
30.0000 mL | Freq: Once | ORAL | Status: AC
  Administered 2023-02-16: 30 mL via ORAL
  Filled 2023-02-16: qty 30

## 2023-02-16 MED ORDER — NITROGLYCERIN 0.4 MG SL SUBL: 0.4000 mg | SUBLINGUAL_TABLET | SUBLINGUAL | Status: DC | PRN

## 2023-02-16 MED ORDER — POTASSIUM CHLORIDE CRYS ER 20 MEQ PO TBCR
60.0000 meq | EXTENDED_RELEASE_TABLET | Freq: Once | ORAL | Status: AC
  Administered 2023-02-16: 60 meq via ORAL
  Filled 2023-02-16: qty 3

## 2023-02-16 MED ORDER — ENOXAPARIN SODIUM 60 MG/0.6ML IJ SOSY
0.5000 mg/kg | PREFILLED_SYRINGE | INTRAMUSCULAR | Status: DC
  Administered 2023-02-17: 60 mg via SUBCUTANEOUS
  Filled 2023-02-16: qty 0.6

## 2023-02-16 MED ORDER — CLONIDINE HCL 0.1 MG PO TABS
0.1000 mg | ORAL_TABLET | Freq: Two times a day (BID) | ORAL | Status: DC
  Administered 2023-02-16 – 2023-02-17 (×2): 0.1 mg via ORAL
  Filled 2023-02-16 (×2): qty 1

## 2023-02-16 MED ORDER — ENOXAPARIN SODIUM 40 MG/0.4ML IJ SOSY: 40.0000 mg | PREFILLED_SYRINGE | INTRAMUSCULAR | Status: DC

## 2023-02-16 MED ORDER — METOPROLOL SUCCINATE ER 25 MG PO TB24: 25.0000 mg | ORAL_TABLET | Freq: Every day | ORAL | Status: DC

## 2023-02-16 MED ORDER — CLONIDINE HCL 0.2 MG PO TABS
0.2000 mg | ORAL_TABLET | Freq: Two times a day (BID) | ORAL | Status: DC
  Administered 2023-02-16: 0.2 mg via ORAL
  Filled 2023-02-16: qty 1

## 2023-02-16 MED ORDER — AMLODIPINE BESYLATE 10 MG PO TABS
10.0000 mg | ORAL_TABLET | Freq: Every day | ORAL | Status: DC
  Administered 2023-02-16 – 2023-02-17 (×2): 10 mg via ORAL
  Filled 2023-02-16: qty 1
  Filled 2023-02-16: qty 2

## 2023-02-16 MED ORDER — ONDANSETRON HCL 4 MG/2ML IJ SOLN
4.0000 mg | Freq: Once | INTRAMUSCULAR | Status: AC
  Administered 2023-02-16: 4 mg via INTRAVENOUS
  Filled 2023-02-16: qty 2

## 2023-02-16 MED ORDER — BUPRENORPHINE HCL-NALOXONE HCL 8-2 MG SL SUBL
1.0000 | SUBLINGUAL_TABLET | Freq: Two times a day (BID) | SUBLINGUAL | Status: DC
  Administered 2023-02-16 – 2023-02-17 (×3): 1 via SUBLINGUAL
  Filled 2023-02-16 (×3): qty 1

## 2023-02-16 MED ORDER — ATORVASTATIN CALCIUM 10 MG PO TABS
10.0000 mg | ORAL_TABLET | Freq: Every day | ORAL | Status: DC
  Administered 2023-02-16 – 2023-02-17 (×2): 10 mg via ORAL
  Filled 2023-02-16 (×2): qty 1

## 2023-02-16 MED ORDER — IOHEXOL 350 MG/ML SOLN
100.0000 mL | Freq: Once | INTRAVENOUS | Status: AC | PRN
  Administered 2023-02-16: 100 mL via INTRAVENOUS

## 2023-02-16 MED ORDER — GABAPENTIN 300 MG PO CAPS
300.0000 mg | ORAL_CAPSULE | Freq: Three times a day (TID) | ORAL | Status: DC
  Administered 2023-02-16 – 2023-02-17 (×4): 300 mg via ORAL
  Filled 2023-02-16 (×4): qty 1

## 2023-02-16 MED ORDER — SODIUM CHLORIDE 0.9 % IV BOLUS
1000.0000 mL | Freq: Once | INTRAVENOUS | Status: AC
  Administered 2023-02-16: 1000 mL via INTRAVENOUS
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
## 2023-02-16 NOTE — ED Triage Notes (Addendum)
Sudden crushing left sided chest pains that woke him up from sleep. Pain radiated down left arm. Diaphoretic.   Several episodes of vomiting prior to arrival.
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
## 2023-02-16 NOTE — Progress Notes (Signed)
  Echocardiogram 2D Echocardiogram has been performed.  Delcie Roch 02/16/2023, 6:13 PM

## 2023-02-16 NOTE — Hospital Course (Addendum)
Summary Patient with a past medical history of GAD, MDD, PTSD, HLD, CAD, and coronary spasm who presented with chest pain. Patient attributed chest pain to significant stress.   Splenic Infarct Patient was found to have peripheral subcapsular focus of hypoperfusion in the dome of the spleen on CT performed on 7/17 suggestive of acute splenic infarction. This was thought to be atheroembolic given aortic valve calcifications. Given reduced splenic function from the infarct, the patient was vaccinated against pneumococcus, meningococcus, and H flu while hospitalized. He will establish care at the IM outpatient clinic and will receive follow-up vaccinations in 8 weeks, which can be given at [facility].  Ascending Aortic Aneurysm HTN Patient has a history of HTN. During this admission, patient was found to have a 4.2 cm ascending aortic aneurysm. Cardiothoracic surgery was consulted by the ED physician, and they recommended conservative management at this time. Per cardiovascular recommendations, he was started on losartan 50 mg daily. He was also started on Toprol 25 mg daily. Their goal was to wean him off clonidine, and so his 0.2 BID dose was weaned to 0.1 mg BID with the goal of continued weaning in the outpatient setting. He was cautioned against lifting more than 15 lbs. He will follow-up with cardiology outpatient.  Aortic Sclerosis An echo with bubble study performed on 7/17 showed a normal LVEF of 60-65% with mild regurgitation and moderate aortic stenosis; bubble study was negative. Suspect aortic stenosis is etiology behind splenic infarct. No intervention indicated during hospitalization. Will continue to monitor. Patient to continue following with cardiology outpatient.  Hepatic Steatosis CT done 7/17 showed hepatic steatosis. Patient will follow-up outpatient with Internal Medicine team.

## 2023-02-16 NOTE — ED Provider Notes (Signed)
  Physical Exam  BP 136/77   Pulse 81   Temp 98.6 F (37 C) (Oral)   Resp 14   Ht 1.778 m (5\' 10" )   Wt 117.9 kg   SpO2 97%   BMI 37.31 kg/m   Physical Exam  Procedures  Procedures  ED Course / MDM   Clinical Course as of 02/16/23 0710  Wed Feb 16, 2023  0710 CT Angio Chest/Abd/Pel for Dissection W and/or Wo Contrast [DR]    Clinical Course User Index [DR] Margarita Grizzle, MD   Medical Decision Making Amount and/or Complexity of Data Reviewed Labs: ordered. Radiology: ordered. Decision-making details documented in ED Course.  Risk OTC drugs. Prescription drug management.   47 yo male history of aortic valve insufficiency, unstable angina, substance abuse induced mood disorder, cannabis use disorder, alcohol use disorder in remission, chronic opioid use, bicuspid aortic valve presented  with pain at sternal notch two mornings in a row, with some arm tingling.  This a.m. the pain awoke him in the early morning hours.  Patient states that the pain felt like he could not breathe and was not particularly painful.  It essentially gone he had associated nausea and vomited several times. Normal exam EKG  CT dissection study performed without  HR was 140s bp elevated Now vs normalizing with iv fluids and antiemetics Awaiting second trop, ct results Will recheck and dispo as approptiate  On initial evaluation, per report from Dr. Wallace Cullens, patient was tachycardic and hypertensive. Patient has received IV fluids and Zofran and Maalox.  His blood pressure and heart rate have normalized Patient had CT obtained that shows no evidence for aortic dissection, aortic valve calcification with 4.2 cm ascending thoracic aortic aneurysm and peripheral subcapsular focus of hypoperfusion in the dome of the spleen with splenic infarct being most likely etiology and hepatic steatosis noted  Patient feels improved Discussed cT results with Dr. Leafy Ro- advises unlikely etiology of pain and advises  consult to cardiology. Patient follows with Dr. Jacinto Halim Discussed with Dr. Odis Hollingshead on call for Dr. Jacinto Halim. Reviewed prior ct chest of 09/23/20 with normal aorta compared to today. Current vitals recorded at BP 146/82 and hr 93, sat normal. Unclear etiology of splenic infarct-will discuss further work up with medicine  Care discussed with Dr. Park Breed who will see patient for admission     Margarita Grizzle, MD 02/16/23 1041

## 2023-02-16 NOTE — Discharge Instructions (Addendum)
It was a pleasure caring for you today in the emergency department.  Please return to the emergency department for any worsening or worrisome symptoms.  Will need a second dose of menveo and Bexsero 8 weeks (04/14/2023)

## 2023-02-16 NOTE — Plan of Care (Signed)
  Problem: Nutrition: Goal: Adequate nutrition will be maintained Outcome: Progressing   Problem: Pain Managment: Goal: General experience of comfort will improve Outcome: Progressing   

## 2023-02-17 DIAGNOSIS — I350 Nonrheumatic aortic (valve) stenosis: Secondary | ICD-10-CM

## 2023-02-17 DIAGNOSIS — F1120 Opioid dependence, uncomplicated: Secondary | ICD-10-CM | POA: Diagnosis not present

## 2023-02-17 LAB — BASIC METABOLIC PANEL
Anion gap: 7 (ref 5–15)
BUN: 12 mg/dL (ref 6–20)
CO2: 27 mmol/L (ref 22–32)
Calcium: 8.4 mg/dL — ABNORMAL LOW (ref 8.9–10.3)
Chloride: 103 mmol/L (ref 98–111)
Creatinine, Ser: 0.8 mg/dL (ref 0.61–1.24)
GFR, Estimated: >60 mL/min (ref >60)
Glucose, Bld: 93 mg/dL (ref 70–99)
Potassium: 3.5 mmol/L (ref 3.5–5.1)
Sodium: 137 mmol/L (ref 135–145)

## 2023-02-17 LAB — CBC
HCT: 38.9 % — ABNORMAL LOW (ref 39.0–52.0)
Hemoglobin: 13.4 g/dL (ref 13.0–17.0)
MCH: 28.9 pg (ref 26.0–34.0)
MCHC: 34.4 g/dL (ref 30.0–36.0)
MCV: 84 fL (ref 80.0–100.0)
Platelets: 196 10*3/uL (ref 150–400)
RBC: 4.63 MIL/uL (ref 4.22–5.81)
RDW: 12.9 % (ref 11.5–15.5)
WBC: 9.7 10*3/uL (ref 4.0–10.5)
nRBC: 0 % (ref 0.0–0.2)

## 2023-02-17 LAB — MAGNESIUM: Magnesium: 2 mg/dL (ref 1.7–2.4)

## 2023-02-17 MED ORDER — CLONIDINE HCL 0.1 MG PO TABS: 0.1000 mg | ORAL_TABLET | Freq: Two times a day (BID) | ORAL | 11 refills | Status: DC

## 2023-02-17 MED ORDER — LOSARTAN POTASSIUM 50 MG PO TABS: 50.0000 mg | ORAL_TABLET | Freq: Every day | ORAL | 0 refills | Status: DC

## 2023-02-17 MED ORDER — MENINGOCOCCAL A C Y&W-135 OLIG IM SOLN
0.5000 mL | Freq: Once | INTRAMUSCULAR | Status: AC
  Administered 2023-02-17: 0.5 mL via INTRAMUSCULAR
  Filled 2023-02-17 (×2): qty 0.5

## 2023-02-17 MED ORDER — NITROGLYCERIN 0.4 MG SL SUBL: 0.4000 mg | SUBLINGUAL_TABLET | SUBLINGUAL | 0 refills | Status: AC | PRN

## 2023-02-17 MED ORDER — MENINGOCOCCAL VAC B (OMV) IM SUSY
0.5000 mL | PREFILLED_SYRINGE | Freq: Once | INTRAMUSCULAR | Status: AC
  Administered 2023-02-17: 0.5 mL via INTRAMUSCULAR
  Filled 2023-02-17: qty 0.5

## 2023-02-17 MED ORDER — PNEUMOCOCCAL 20-VAL CONJ VACC 0.5 ML IM SUSY
0.5000 mL | PREFILLED_SYRINGE | Freq: Once | INTRAMUSCULAR | Status: AC
  Administered 2023-02-17: 0.5 mL via INTRAMUSCULAR
  Filled 2023-02-17: qty 0.5

## 2023-02-17 MED ORDER — ACETAMINOPHEN 325 MG PO TABS: 650.0000 mg | ORAL_TABLET | Freq: Four times a day (QID) | ORAL | 0 refills | Status: DC | PRN

## 2023-02-17 MED ORDER — METOPROLOL SUCCINATE ER 25 MG PO TB24: 25.0000 mg | ORAL_TABLET | Freq: Every day | ORAL | 0 refills | Status: DC

## 2023-02-17 MED ORDER — HAEMOPHILUS B POLYSAC CONJ VAC 10 MCG IJ SOLR
0.5000 mL | Freq: Once | INTRAMUSCULAR | Status: AC
  Administered 2023-02-17: 0.5 mL via INTRAMUSCULAR
  Filled 2023-02-17 (×2): qty 0.5

## 2023-02-17 NOTE — Plan of Care (Signed)
  Problem: Education: Goal: Knowledge of General Education information will improve Description: Including pain rating scale, medication(s)/side effects and non-pharmacologic comfort measures 02/17/2023 0318 by Delcie Roch, RN Outcome: Progressing 02/17/2023 0131 by Delcie Roch, RN Outcome: Progressing   Problem: Health Behavior/Discharge Planning: Goal: Ability to manage health-related needs will improve Outcome: Progressing   Problem: Clinical Measurements: Goal: Ability to maintain clinical measurements within normal limits will improve 02/17/2023 0318 by Delcie Roch, RN Outcome: Progressing 02/17/2023 0131 by Delcie Roch, RN Outcome: Progressing Goal: Will remain free from infection 02/17/2023 0318 by Delcie Roch, RN Outcome: Progressing 02/17/2023 0131 by Delcie Roch, RN Outcome: Progressing   Problem: Activity: Goal: Risk for activity intolerance will decrease Outcome: Progressing   Problem: Nutrition: Goal: Adequate nutrition will be maintained Outcome: Progressing   Problem: Coping: Goal: Level of anxiety will decrease Outcome: Progressing   Problem: Elimination: Goal: Will not experience complications related to bowel motility Outcome: Progressing   Problem: Pain Managment: Goal: General experience of comfort will improve 02/17/2023 0318 by Delcie Roch, RN Outcome: Progressing 02/17/2023 0131 by Delcie Roch, RN Outcome: Progressing   Problem: Safety: Goal: Ability to remain free from injury will improve 02/17/2023 0318 by Delcie Roch, RN Outcome: Progressing 02/17/2023 0131 by Delcie Roch, RN Outcome: Progressing

## 2023-02-17 NOTE — Discharge Summary (Signed)
error 

## 2023-02-17 NOTE — Plan of Care (Signed)
  Problem: Education: Goal: Knowledge of General Education information will improve Description: Including pain rating scale, medication(s)/side effects and non-pharmacologic comfort measures Outcome: Progressing   Problem: Health Behavior/Discharge Planning: Goal: Ability to manage health-related needs will improve Outcome: Progressing   Problem: Clinical Measurements: Goal: Ability to maintain clinical measurements within normal limits will improve Outcome: Progressing Goal: Will remain free from infection Outcome: Progressing   Problem: Nutrition: Goal: Adequate nutrition will be maintained Outcome: Progressing   Problem: Coping: Goal: Level of anxiety will decrease Outcome: Progressing   Problem: Pain Managment: Goal: General experience of comfort will improve Outcome: Progressing   Problem: Safety: Goal: Ability to remain free from injury will improve Outcome: Progressing   

## 2023-02-17 NOTE — Plan of Care (Signed)

## 2023-02-17 NOTE — TOC Initial Note (Signed)
Transition of Care [facility]) - Initial/Assessment Note    Patient Details  Name: NITHIN DEMEO MRN: [id number] Date of Birth: [birth year]  Transition [facility]) CM/SW Contact:    Kingsley Plan, RN Phone Number: 02/17/2023, 10:11 AM  Clinical Narrative:                  Spoke to patient at bedside.   Patient from home.   Patient does not have a PCP but stated "they" are going to let arrange an appointment and let me know. NCM asked who they are , patient had [facility] card . NCM secure chatted attending team to verify . Await response  Expected Discharge Plan: Home/Self Care Barriers to Discharge: Continued Medical Work up   Patient Goals and CMS Choice Patient states their goals for this hospitalization and ongoing recovery are:: to return to home          Expected Discharge Plan and Services   Discharge Planning Services: CM Consult   Living arrangements for the past 2 months: Single Family Home                 DME Arranged: N/A         HH Arranged: NA          Prior Living Arrangements/Services Living arrangements for the past 2 months: Single Family Home Lives with:: Spouse Patient language and need for interpreter reviewed:: Yes Do you feel safe going back to the place where you live?: Yes      Need for Family Participation in Patient Care: No (Comment) Care giver support system in place?: Yes (comment)   Criminal Activity/Legal Involvement Pertinent to Current Situation/Hospitalization: No - Comment as needed  Activities of Daily Living      Permission Sought/Granted   Permission granted to share information with : No              Emotional Assessment Appearance:: Appears stated age Attitude/Demeanor/Rapport: Engaged Affect (typically observed): Accepting Orientation: : Oriented to Self, Oriented to Place, Oriented to  Time, Oriented to Situation Alcohol / Substance Use: Not Applicable Psych Involvement: No  (comment)  Admission diagnosis:  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] Chest pain, unspecified type [R07.9] Nausea and vomiting, unspecified vomiting type [R11.2] Patient Active Problem List   Diagnosis Date Noted   Nonrheumatic aortic valve stenosis 02/17/2023   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   Precordial pain 02/16/2023   Aneurysm of ascending aorta without rupture (HCC) 02/16/2023   Abnormal CT scan 02/16/2023   Benign hypertension 02/16/2023   Former smoker 02/16/2023   Cannabis use disorder 01/10/2023   Alcohol use disorder in remission 01/10/2023   Severe benzodiazepine use disorder (HCC) 01/10/2023   Chronic, continuous use of opioids 01/10/2023   Substance induced mood disorder (HCC) 01/09/2023   Coronary artery spasm (HCC) 09/24/2020   Bicuspid aortic valve with ascending aorta 4.0 to 4.5 cm in diameter 09/24/2020   Tobacco use 09/24/2020   Unstable angina (HCC) 09/24/2020   Nonspecific abnormal electrocardiogram (ECG) (EKG)    Nonrheumatic aortic valve insufficiency    PCP:  Patient, No Pcp Per Pharmacy:   [id number] - [place], [place] - [address] AT [facility] [address] [place] [address] Phone: [phone] Fax: [phone]     Social Determinants of Health (SDOH) Social History: SDOH Screenings   Food Insecurity: No Food Insecurity (01/09/2023)  Housing: Low Risk  (01/09/2023)  Transportation Needs: No Transportation  Needs (01/09/2023)  Utilities: Not At Risk (01/09/2023)  Alcohol Screen: Low Risk  (01/10/2023)  Tobacco Use: Medium Risk (02/16/2023)   SDOH Interventions:     Readmission Risk Interventions     No data to display
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
## 2023-02-18 LAB — CULTURE, BLOOD (ROUTINE X 2): Culture: NO GROWTH

## 2023-02-19 LAB — CULTURE, BLOOD (ROUTINE X 2)

## 2023-02-20 DIAGNOSIS — F1120 Opioid dependence, uncomplicated: Secondary | ICD-10-CM | POA: Diagnosis not present

## 2023-02-20 DIAGNOSIS — I519 Heart disease, unspecified: Secondary | ICD-10-CM | POA: Diagnosis not present

## 2023-02-21 LAB — CULTURE, BLOOD (ROUTINE X 2): Special Requests: ADEQUATE

## 2023-02-22 ENCOUNTER — Encounter: Payer: Self-pay | Admitting: Cardiology

## 2023-02-22 ENCOUNTER — Ambulatory Visit: Payer: 59 | Admitting: Cardiology

## 2023-02-22 VITALS — BP 103/65 | HR 60 | Resp 16 | Ht 70.0 in | Wt 263.0 lb

## 2023-02-22 DIAGNOSIS — Q231 Congenital insufficiency of aortic valve: Secondary | ICD-10-CM

## 2023-02-22 DIAGNOSIS — E7800 Pure hypercholesterolemia, unspecified: Secondary | ICD-10-CM

## 2023-02-22 DIAGNOSIS — I7121 Aneurysm of the ascending aorta, without rupture: Secondary | ICD-10-CM

## 2023-02-22 DIAGNOSIS — R112 Nausea with vomiting, unspecified: Secondary | ICD-10-CM

## 2023-02-22 DIAGNOSIS — R0683 Snoring: Secondary | ICD-10-CM

## 2023-02-22 LAB — CULTURE, BLOOD (ROUTINE X 2): Culture: NO GROWTH

## 2023-02-22 MED ORDER — ATORVASTATIN CALCIUM 40 MG PO TABS
40.0000 mg | ORAL_TABLET | Freq: Every day | ORAL | 3 refills | Status: DC
Start: 2023-02-22 — End: 2023-03-02
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
## 2023-03-02 ENCOUNTER — Encounter: Payer: Self-pay | Admitting: Student

## 2023-03-02 ENCOUNTER — Other Ambulatory Visit: Payer: Self-pay

## 2023-03-02 ENCOUNTER — Ambulatory Visit (INDEPENDENT_AMBULATORY_CARE_PROVIDER_SITE_OTHER): Payer: 59 | Admitting: Student

## 2023-03-02 VITALS — BP 124/74 | HR 75 | Temp 98.2°F | Ht 69.0 in | Wt 261.6 lb

## 2023-03-02 DIAGNOSIS — I200 Unstable angina: Secondary | ICD-10-CM

## 2023-03-02 DIAGNOSIS — Z135 Encounter for screening for eye and ear disorders: Secondary | ICD-10-CM

## 2023-03-02 DIAGNOSIS — F1190 Opioid use, unspecified, uncomplicated: Secondary | ICD-10-CM

## 2023-03-02 DIAGNOSIS — E7800 Pure hypercholesterolemia, unspecified: Secondary | ICD-10-CM

## 2023-03-02 DIAGNOSIS — I10 Essential (primary) hypertension: Secondary | ICD-10-CM

## 2023-03-02 DIAGNOSIS — Z87891 Personal history of nicotine dependence: Secondary | ICD-10-CM

## 2023-03-02 DIAGNOSIS — Q231 Congenital insufficiency of aortic valve: Secondary | ICD-10-CM

## 2023-03-02 DIAGNOSIS — D735 Infarction of spleen: Secondary | ICD-10-CM | POA: Diagnosis not present

## 2023-03-02 DIAGNOSIS — F418 Other specified anxiety disorders: Secondary | ICD-10-CM

## 2023-03-02 MED ORDER — CLONIDINE HCL 0.1 MG PO TABS: 0.1000 mg | ORAL_TABLET | Freq: Two times a day (BID) | ORAL | 1 refills | Status: DC

## 2023-03-02 MED ORDER — METOPROLOL SUCCINATE ER 25 MG PO TB24: 25.0000 mg | ORAL_TABLET | Freq: Every day | ORAL | 11 refills | Status: DC

## 2023-03-02 MED ORDER — AMLODIPINE BESYLATE 10 MG PO TABS: 10.0000 mg | ORAL_TABLET | Freq: Every day | ORAL | 1 refills | Status: DC

## 2023-03-02 MED ORDER — LOSARTAN POTASSIUM 50 MG PO TABS: 50.0000 mg | ORAL_TABLET | Freq: Every day | ORAL | 1 refills | Status: DC

## 2023-03-02 MED ORDER — ATORVASTATIN CALCIUM 40 MG PO TABS
40.0000 mg | ORAL_TABLET | Freq: Every day | ORAL | 1 refills | Status: DC
Start: 2023-03-02 — End: 2023-03-02

## 2023-03-02 MED ORDER — CLONIDINE HCL 0.1 MG PO TABS: 0.1000 mg | ORAL_TABLET | Freq: Two times a day (BID) | ORAL | 11 refills | Status: DC

## 2023-03-02 MED ORDER — METOPROLOL SUCCINATE ER 25 MG PO TB24: 25.0000 mg | ORAL_TABLET | Freq: Every day | ORAL | 1 refills | Status: DC

## 2023-03-02 MED ORDER — ATORVASTATIN CALCIUM 40 MG PO TABS
40.0000 mg | ORAL_TABLET | Freq: Every day | ORAL | 1 refills | Status: DC
Start: 2023-03-02 — End: 2023-08-04

## 2023-03-02 NOTE — Patient Instructions (Addendum)
Thank you, [name] for allowing [name] to provide your care today. Today we discussed your recent hospitalization, your specialists, your medication needs, and your mental health.    I have ordered the following labs for you:   Lab Orders         BMP8+Anion Gap      I will call if any are abnormal. All of your labs can be accessed through "My Chart".   My Chart Access: [email or web address]  Please follow-up in: after you follow up with your cardiologist     We look forward to seeing you next time. Please call our clinic at [phone] if you have any questions or concerns. The best time to call is Monday-Friday from 9am-4pm, but there is someone available 24/7. If after hours or the weekend, call the main hospital number and ask for the Internal Medicine Resident On-Call. If you need medication refills, please notify your pharmacy one week in advance and they will send [name] a request.   Thank you for letting us take part in your care. Wishing you the best!  Morene Crocker, MD 03/02/2023, 3:31 PM [facility] Internal Medicine Resident, PGY-1
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
## 2023-03-03 ENCOUNTER — Emergency Department (HOSPITAL_COMMUNITY)
Admission: EM | Admit: 2023-03-03 | Discharge: 2023-03-03 | Disposition: A | Discharge status: Home / Self Care | Payer: 59 | Attending: Emergency Medicine | Admitting: Emergency Medicine

## 2023-03-03 ENCOUNTER — Emergency Department (HOSPITAL_COMMUNITY): Payer: 59

## 2023-03-03 ENCOUNTER — Encounter (HOSPITAL_COMMUNITY): Payer: Self-pay | Admitting: Emergency Medicine

## 2023-03-03 ENCOUNTER — Other Ambulatory Visit: Payer: Self-pay

## 2023-03-03 DIAGNOSIS — R61 Generalized hyperhidrosis: Secondary | ICD-10-CM | POA: Diagnosis not present

## 2023-03-03 DIAGNOSIS — F3341 Major depressive disorder, recurrent, in partial remission: Secondary | ICD-10-CM | POA: Insufficient documentation

## 2023-03-03 DIAGNOSIS — Z20822 Contact with and (suspected) exposure to covid-19: Secondary | ICD-10-CM | POA: Diagnosis not present

## 2023-03-03 DIAGNOSIS — E872 Acidosis, unspecified: Secondary | ICD-10-CM | POA: Diagnosis not present

## 2023-03-03 DIAGNOSIS — F418 Other specified anxiety disorders: Secondary | ICD-10-CM | POA: Insufficient documentation

## 2023-03-03 DIAGNOSIS — R109 Unspecified abdominal pain: Secondary | ICD-10-CM | POA: Insufficient documentation

## 2023-03-03 DIAGNOSIS — D72829 Elevated white blood cell count, unspecified: Secondary | ICD-10-CM | POA: Insufficient documentation

## 2023-03-03 DIAGNOSIS — I10 Essential (primary) hypertension: Secondary | ICD-10-CM | POA: Diagnosis not present

## 2023-03-03 DIAGNOSIS — R0602 Shortness of breath: Secondary | ICD-10-CM | POA: Diagnosis present

## 2023-03-03 DIAGNOSIS — R112 Nausea with vomiting, unspecified: Secondary | ICD-10-CM

## 2023-03-03 DIAGNOSIS — Z79899 Other long term (current) drug therapy: Secondary | ICD-10-CM | POA: Diagnosis not present

## 2023-03-03 LAB — I-STAT CG4 LACTIC ACID, ED
Lactic Acid, Venous: 3.2 mmol/L (ref 0.5–1.9)
Lactic Acid, Venous: 1.2 mmol/L (ref 0.5–1.9)

## 2023-03-03 LAB — CBC WITH DIFFERENTIAL/PLATELET
Abs Immature Granulocytes: 0.03 10*3/uL (ref 0.00–0.07)
Basophils Absolute: 0.1 10*3/uL (ref 0.0–0.1)
Basophils Relative: 0 %
Eosinophils Absolute: 0.3 10*3/uL (ref 0.0–0.5)
Eosinophils Relative: 2 %
HCT: 48.7 % (ref 39.0–52.0)
Hemoglobin: 16.8 g/dL (ref 13.0–17.0)
Immature Granulocytes: 0 %
Lymphocytes Relative: 39 %
Lymphs Abs: 4.7 10*3/uL — ABNORMAL HIGH (ref 0.7–4.0)
MCH: 28.8 pg (ref 26.0–34.0)
MCHC: 34.5 g/dL (ref 30.0–36.0)
MCV: 83.4 fL (ref 80.0–100.0)
Monocytes Absolute: 0.6 10*3/uL (ref 0.1–1.0)
Monocytes Relative: 5 %
Neutro Abs: 6.4 10*3/uL (ref 1.7–7.7)
Neutrophils Relative %: 54 %
Platelets: 302 10*3/uL (ref 150–400)
RBC: 5.84 MIL/uL — ABNORMAL HIGH (ref 4.22–5.81)
RDW: 12.9 % (ref 11.5–15.5)
WBC: 12 10*3/uL — ABNORMAL HIGH (ref 4.0–10.5)
nRBC: 0 % (ref 0.0–0.2)

## 2023-03-03 LAB — COMPREHENSIVE METABOLIC PANEL
ALT: 48 U/L — ABNORMAL HIGH (ref 0–44)
AST: 37 U/L (ref 15–41)
Albumin: 4.5 g/dL (ref 3.5–5.0)
Alkaline Phosphatase: 91 U/L (ref 38–126)
Anion gap: 18 — ABNORMAL HIGH (ref 5–15)
BUN: 12 mg/dL (ref 6–20)
CO2: 22 mmol/L (ref 22–32)
Calcium: 9.4 mg/dL (ref 8.9–10.3)
Chloride: 100 mmol/L (ref 98–111)
Creatinine, Ser: 0.93 mg/dL (ref 0.61–1.24)
GFR, Estimated: >60 mL/min (ref >60)
Glucose, Bld: 162 mg/dL — ABNORMAL HIGH (ref 70–99)
Potassium: 3.5 mmol/L (ref 3.5–5.1)
Sodium: 140 mmol/L (ref 135–145)
Total Bilirubin: 0.8 mg/dL (ref 0.3–1.2)
Total Protein: 8.2 g/dL — ABNORMAL HIGH (ref 6.5–8.1)

## 2023-03-03 LAB — URINALYSIS, ROUTINE W REFLEX MICROSCOPIC
Bilirubin Urine: NEGATIVE
Glucose, UA: NEGATIVE mg/dL
Hgb urine dipstick: NEGATIVE
Ketones, ur: NEGATIVE mg/dL
Leukocytes,Ua: NEGATIVE
Nitrite: NEGATIVE
Protein, ur: NEGATIVE mg/dL
Specific Gravity, Urine: >1.046 — ABNORMAL HIGH (ref 1.005–1.030)
pH: 7 (ref 5.0–8.0)

## 2023-03-03 LAB — I-STAT ARTERIAL BLOOD GAS, ED
Acid-Base Excess: 0 mmol/L (ref 0.0–2.0)
Bicarbonate: 24.7 mmol/L (ref 20.0–28.0)
Calcium, Ion: 1.14 mmol/L — ABNORMAL LOW (ref 1.15–1.40)
HCT: 42 % (ref 39.0–52.0)
Hemoglobin: 14.3 g/dL (ref 13.0–17.0)
O2 Saturation: 97 %
Patient temperature: 98.6
Potassium: 3.4 mmol/L — ABNORMAL LOW (ref 3.5–5.1)
Sodium: 140 mmol/L (ref 135–145)
TCO2: 26 mmol/L (ref 22–32)
pCO2 arterial: 41.3 mmHg (ref 32–48)
pH, Arterial: 7.385 (ref 7.35–7.45)
pO2, Arterial: 88 mmHg (ref 83–108)

## 2023-03-03 LAB — D-DIMER, QUANTITATIVE: D-Dimer, Quant: <0.27 ug/mL-FEU (ref 0.00–0.50)

## 2023-03-03 LAB — SARS CORONAVIRUS 2 BY RT PCR: SARS Coronavirus 2 by RT PCR: NEGATIVE

## 2023-03-03 LAB — TROPONIN I (HIGH SENSITIVITY)
Troponin I (High Sensitivity): 5 ng/L (ref <18)
Troponin I (High Sensitivity): 5 ng/L (ref <18)

## 2023-03-03 MED ORDER — ONDANSETRON HCL 4 MG/2ML IJ SOLN
4.0000 mg | Freq: Once | INTRAMUSCULAR | Status: AC
  Administered 2023-03-03: 4 mg via INTRAVENOUS
  Filled 2023-03-03: qty 2

## 2023-03-03 MED ORDER — ALBUTEROL SULFATE HFA 108 (90 BASE) MCG/ACT IN AERS
2.0000 | INHALATION_SPRAY | RESPIRATORY_TRACT | Status: DC | PRN
  Filled 2023-03-03: qty 6.7

## 2023-03-03 MED ORDER — PANTOPRAZOLE SODIUM 40 MG IV SOLR
40.0000 mg | Freq: Once | INTRAVENOUS | Status: AC
  Administered 2023-03-03: 40 mg via INTRAVENOUS
  Filled 2023-03-03: qty 10

## 2023-03-03 MED ORDER — ONDANSETRON 8 MG PO TBDP: 8.0000 mg | ORAL_TABLET | Freq: Three times a day (TID) | ORAL | 0 refills | Status: DC | PRN

## 2023-03-03 MED ORDER — ACETAMINOPHEN 325 MG PO TABS
650.0000 mg | ORAL_TABLET | Freq: Once | ORAL | Status: AC
  Administered 2023-03-03: 650 mg via ORAL
  Filled 2023-03-03: qty 2

## 2023-03-03 MED ORDER — CLINDAMYCIN PHOSPHATE 600 MG/50ML IV SOLN
600.0000 mg | Freq: Once | INTRAVENOUS | Status: DC
Start: 2023-03-03 — End: 2023-03-03

## 2023-03-03 MED ORDER — IOHEXOL 350 MG/ML SOLN
75.0000 mL | Freq: Once | INTRAVENOUS | Status: AC | PRN
  Administered 2023-03-03: 75 mL via INTRAVENOUS

## 2023-03-03 MED ORDER — LACTATED RINGERS IV BOLUS
1000.0000 mL | Freq: Once | INTRAVENOUS | Status: AC
  Administered 2023-03-03: 1000 mL via INTRAVENOUS

## 2023-03-03 NOTE — ED Provider Notes (Signed)
Patient initially seen by Dr. Consuella Lose.  Please see his note.  Plan was to follow-up on blood tests including urinalysis and serial troponin.  Initial labs do show slight elevation white blood cell count.  No significant electrolyte abnormalities.  Troponin and D-dimer negative.  COVID is negative.  Lactic acid level is normal.  Initial troponin is normal.  Blood gas does not show any acute abnormalities.  Patient's abdominal pelvic CT scan does not show any acute abnormality in the abdomen or pelvis.  Patient's chest x-ray did not show any evidence of pneumonia.  Urinalysis not suggestive of UTI and repeat lactic acid level normal.  Patient is feeling well at this time.  No signs of recurrent vomiting.  Unclear etiology of his symptoms, possible viral process.  Evaluation and diagnostic testing in the emergency department does not suggest an emergent condition requiring admission or immediate intervention beyond what has been performed at this time.  The patient is safe for discharge and has been instructed to return immediately for worsening symptoms, change in symptoms or any other concerns.    EKG Interpretation Date/Time:  Thursday March 03 2023 05:40:21 EDT Ventricular Rate:  97 PR Interval:  187 QRS Duration:  86 QT Interval:  355 QTC Calculation: 451 R Axis:   7  Text Interpretation: Sinus rhythm Probable anteroseptal infarct, old No significant change since last tracing Confirmed by Linwood Dibbles [phone]) on 03/03/2023 8:32:31 AM          Linwood Dibbles, MD 03/03/23 [phone]

## 2023-03-03 NOTE — Discharge Instructions (Signed)
Take the medications as needed for nausea and vomiting.  Follow-up with your doctor next week to be rechecked.  Return as needed for worsening symptoms

## 2023-03-03 NOTE — ED Provider Notes (Signed)
[place] EMERGENCY DEPARTMENT AT [facility] Provider Note   CSN: [id number] Arrival date & time: 03/03/23  4098     History  Chief Complaint  Patient presents with   Shortness of Breath    [name] is a 47 y.o. male.  Patient with history of hypertension, bicuspid aortic valve, ascending aortic aneurysm, GERD, recent admission for splenic infarct here with sudden onset shortness of breath, nausea, dry heaves and chills that woke him up from sleep about 430.  He felt well going to bed last night.  He just saw his PCP yesterday had a reassuring workup.  States he woke up feeling short of breath with chills cold sweats nausea and dry heaving and vomiting.  Denies pain anywhere.  Denies having a fever.  Denies abdominal pain, chest pain, headache.  No focal weakness, numbness or tingling.  No travel or sick contacts.  No pain with urination or blood in the urine.  States when he was hospitalized for a splenic infarct he was not sent home on any blood thinners.  The history is provided by the patient.  Shortness of Breath Associated symptoms: diaphoresis and vomiting   Associated symptoms: no chest pain, no fever and no rash        Home Medications Prior to Admission medications   Medication Sig Start Date End Date Taking? Authorizing Provider  acetaminophen (TYLENOL) 325 MG tablet Take 2 tablets (650 mg total) by mouth every 6 (six) hours as needed for mild pain (or Fever >/= 101). 02/17/23   Philomena Doheny, MD  amLODipine (NORVASC) 10 MG tablet Take 1 tablet (10 mg total) by mouth daily. 03/02/23 03/01/24  Morene Crocker, MD  atorvastatin (LIPITOR) 40 MG tablet Take 1 tablet (40 mg total) by mouth daily. 03/02/23 03/01/24  Morene Crocker, MD  Buprenorphine HCl-Naloxone HCl 12-3 MG FILM Place 1 Film under the tongue 2 (two) times daily.    [provider]  busPIRone (BUSPAR) 5 MG tablet Take 5 mg by mouth 3 (three) times daily. 02/01/23    [provider]  cloNIDine (CATAPRES) 0.1 MG tablet Take 1 tablet (0.1 mg total) by mouth 2 (two) times daily. 03/02/23 03/01/24  Morene Crocker, MD  FLUoxetine (PROZAC) 40 MG capsule Take 40 mg by mouth daily.    [provider]  gabapentin (NEURONTIN) 300 MG capsule Take 1 capsule (300 mg total) by mouth 3 (three) times daily. 01/13/23 02/22/23  Princess Bruins, DO  losartan (COZAAR) 50 MG tablet Take 1 tablet (50 mg total) by mouth daily at 10 pm. 03/02/23 03/01/24  Morene Crocker, MD  metoprolol succinate (TOPROL-XL) 25 MG 24 hr tablet Take 1 tablet (25 mg total) by mouth daily. 03/02/23 03/01/24  Morene Crocker, MD  nitroGLYCERIN (NITROSTAT) 0.4 MG SL tablet Place 1 tablet (0.4 mg total) under the tongue every 5 (five) minutes as needed for chest pain. 02/17/23   Philomena Doheny, MD      Allergies    Patient has no known allergies.    Review of Systems   Review of Systems  Constitutional:  Positive for chills, diaphoresis and fatigue. Negative for activity change, appetite change and fever.  HENT:  Negative for congestion.   Respiratory:  Positive for shortness of breath.   Cardiovascular:  Negative for chest pain.  Gastrointestinal:  Positive for nausea and vomiting.  Genitourinary:  Negative for dysuria and hematuria. Penile pain: .ro. Musculoskeletal:  Negative for arthralgias and myalgias.  Skin:  Negative for  rash.  Neurological:  Positive for weakness and light-headedness. Negative for dizziness. Seizures: .ro.ro.   all other systems are negative except as noted in the HPI and PMH.   Physical Exam Updated Vital Signs BP (!) 162/109 (BP Location: Right Arm)   Pulse (!) 114   Temp 98.2 F (36.8 C) (Oral)   Resp 17   Ht 5\' 9"  (1.753 m)   Wt 118 kg   SpO2 100%   BMI 38.42 kg/m  Physical Exam Vitals and nursing note reviewed.  Constitutional:      General: He is not in acute distress.    Appearance: He is well-developed. He is  obese. He is ill-appearing and diaphoretic.     Comments: Diaphoretic, dry heaving  HENT:     Head: Normocephalic and atraumatic.     Mouth/Throat:     Pharynx: No oropharyngeal exudate.  Eyes:     Conjunctiva/sclera: Conjunctivae normal.     Pupils: Pupils are equal, round, and reactive to light.  Neck:     Comments: No meningismus. Cardiovascular:     Rate and Rhythm: Regular rhythm. Tachycardia present.     Heart sounds: Murmur heard.  Pulmonary:     Effort: Pulmonary effort is normal. No respiratory distress.     Breath sounds: Normal breath sounds.  Abdominal:     Palpations: Abdomen is soft.     Tenderness: There is no abdominal tenderness. There is no guarding or rebound.  Musculoskeletal:        General: No tenderness. Normal range of motion.     Cervical back: Normal range of motion and neck supple.  Skin:    General: Skin is warm.  Neurological:     Mental Status: He is alert and oriented to person, place, and time.     Cranial Nerves: No cranial nerve deficit.     Motor: No abnormal muscle tone.     Coordination: Coordination normal.     Comments:  5/5 strength throughout. CN 2-12 intact.Equal grip strength.   Psychiatric:        Behavior: Behavior normal.     ED Results / Procedures / Treatments   Labs (all labs ordered are listed, but only abnormal results are displayed) Labs Reviewed  CBC WITH DIFFERENTIAL/PLATELET - Abnormal; Notable for the following components:      Result Value   WBC 12.0 (*)    RBC 5.84 (*)    Lymphs Abs 4.7 (*)    All other components within normal limits  COMPREHENSIVE METABOLIC PANEL - Abnormal; Notable for the following components:   Glucose, Bld 162 (*)    Total Protein 8.2 (*)    ALT 48 (*)    Anion gap 18 (*)    All other components within normal limits  I-STAT CG4 LACTIC ACID, ED - Abnormal; Notable for the following components:   Lactic Acid, Venous 3.2 (*)    All other components within normal limits  I-STAT ARTERIAL  BLOOD GAS, ED - Abnormal; Notable for the following components:   Potassium 3.4 (*)    Calcium, Ion 1.14 (*)    All other components within normal limits  SARS CORONAVIRUS 2 BY RT PCR  CULTURE, BLOOD (ROUTINE X 2)  CULTURE, BLOOD (ROUTINE X 2)  D-DIMER, QUANTITATIVE  URINALYSIS, ROUTINE W REFLEX MICROSCOPIC  I-STAT CG4 LACTIC ACID, ED  TROPONIN I (HIGH SENSITIVITY)  TROPONIN I (HIGH SENSITIVITY)    EKG EKG Interpretation Date/Time:  Thursday March 03 2023 05:30:15 EDT Ventricular Rate:  119 PR Interval:  170 QRS Duration:  90 QT Interval:  338 QTC Calculation: 475 R Axis:   17  Text Interpretation: Sinus tachycardia Right atrial enlargement Septal infarct , age undetermined Abnormal ECG When compared with ECG of 16-Feb-2023 05:13, PREVIOUS ECG IS PRESENT Rate faster Confirmed by Glynn Octave [phone]) on 03/03/2023 5:40:09 AM  Radiology DG Chest 2 View  Result Date: 03/03/2023 CLINICAL DATA:  Shortness of breath EXAM: CHEST - 2 VIEW COMPARISON:  01/17/2023 FINDINGS: Artifact from EKG leads. Normal heart size and mediastinal contours. No acute infiltrate or edema. No effusion or pneumothorax. No acute osseous findings. IMPRESSION: No active cardiopulmonary disease. Electronically Signed   By: Tiburcio Pea M.D.   On: 03/03/2023 06:31    Procedures Procedures    Medications Ordered in ED Medications  albuterol (VENTOLIN HFA) 108 (90 Base) MCG/ACT inhaler 2 puff (has no administration in time range)  ondansetron (ZOFRAN) injection 4 mg (4 mg Intravenous Given 03/03/23 0605)  pantoprazole (PROTONIX) injection 40 mg (40 mg Intravenous Given 03/03/23 0605)  lactated ringers bolus 1,000 mL (0 mLs Intravenous Stopped 03/03/23 0655)  iohexol (OMNIPAQUE) 350 MG/ML injection 75 mL (75 mLs Intravenous Contrast Given 03/03/23 1324)    ED Course/ Medical Decision Making/ A&P                                 Medical Decision Making Amount and/or Complexity of Data Reviewed Labs: ordered.  Decision-making details documented in ED Course. Radiology: ordered and independent interpretation performed. Decision-making details documented in ED Course. ECG/medicine tests: ordered and independent interpretation performed. Decision-making details documented in ED Course.  Risk Prescription drug management.   Sudden onset shortness of breath with nausea, vomiting, chills and dry heaving.  EKG is sinus tachycardia without acute ST changes. Blood pressure stable, afebrile.  Patient recently admitted for chest pain and found to have aortic aneurysm as well as splenic infarct.  Chest pain was not thought to be cardiac in origin.  Patient tachycardic on arrival.  Not febrile.  Stable mental status and blood pressure.  Denies pain.  Given IV fluids.  Labs were obtained.  Found to have leukocytosis and lactic acidosis.  No fever.  Unclear whether this represents infection.  Chest x-ray is negative for infiltrate.  Results reviewed and interpreted by me.  Urinalysis, Covid testing pending at shift change.  CT abdomen and pelvis ordered to evaluate etiology of nausea, vomiting, and lactic acidosis.   Patient receiving IV fluids at shift change.  Awaiting urinalysis, repeat troponin and CT abdomen and pelvis for further assessment of his nausea, vomiting and lactic acidosis.  No obvious source of infection thus far.  Will need reassessment to ensure clearance of lactate and symptomatic improvement.  Care to be Transferred at shift change.        Final Clinical Impression(s) / ED Diagnoses Final diagnoses:  None    Rx / DC Orders ED Discharge Orders     None         Deontez Klinke, Jeannett Senior, MD 03/03/23 [phone]

## 2023-03-03 NOTE — Addendum Note (Signed)
Addended by: Daiva Eves, Byrd Hesselbach on: 03/03/2023 11:04 AM   Modules accepted: Level of Service

## 2023-03-03 NOTE — Assessment & Plan Note (Signed)
Peripheral subcapsular focus of hypoperfusion in the dome of the spleen suggestive of acute splenic infarction on CT at recent hospitalization. Etiology thought to be in the setting of known arthrosclerotic disease vs embolic.  S/p pneumococcal, meningiococcal, andH flu vaccine. Patient denies abdominal pain, nausea, vomiting. Normal exam today. -Needs follow up vaccinations in 8 weeks -Continue monitoring for signs or symptoms of abscess or complications

## 2023-03-03 NOTE — Assessment & Plan Note (Signed)
Patient reports chronic issues that feel as "baseline". Has been on multiple SSRIs and Buspar in the past for treatment of same. He has been restarted on Prozac since June. Dose has since increased to 40 mg daily. He is also attending group therapy, is seeing an individual therapist, and just established with a psychiatrist on [facility]. This psychiatrist will be taking over prescribing and adjusting MDD and anxiety medications. Patient denies SI/HI today - Monitor with serial PHQ9 and GAD 7 for signs of improvement

## 2023-03-03 NOTE — Assessment & Plan Note (Signed)
Biscuspid aortic valve with fusion of RCC and LCC.  2024 TTE shows progression to moderate aortic valve stenosis ( peak velocity 3.35 m/s, gradient of 45 mm Hg when compared to 2022LHC aortic valve peak gradient of ). Patient is a established patient of Dr. Verl Dicker. Patient was last seen in clinic on 02/22/2023.  Discussed the need for risk factor modification, especially blood pressure control and weight management for likely need of future AV replacement surgery.   Given history of aortic root dilation, congenital valve disease, and patient's body proportions, team discussed including Marfan's syndrome as a possible diagnosis. Patient does not have eye related history but does not have an ophthalmologist or related eye disease on file. Will consider going through detailed the Marfan Foundation's systemic score to aid in clinically diagnosing this disease. Patient was also advised to be evaluated by ophthalmology. -Continue antihypertensive regimen and statin -15lb weight limit -Referral to ophthalmology

## 2023-03-03 NOTE — ED Notes (Signed)
Patient transported to CT 

## 2023-03-03 NOTE — Progress Notes (Signed)
 Internal Medicine Clinic Attending  Case discussed with the resident physician at the time of the visit.  We reviewed the patient's history, exam, and pertinent patient test results.  I agree with the assessment, diagnosis, and plan of care documented in the resident's note.

## 2023-03-03 NOTE — Assessment & Plan Note (Addendum)
At goal on current regimen. Patient unfortunately lost his medications during recent travels earlier in the week and requests refills today. Given recent changes in medications, will check BMP today to monitor renal function and electrolytes.  -BMP today -Losartan 50 mg daily.  -Toprol 25 mg daily. -Clonidine 0.1 mg BID -Amlodipine 10 mg daily

## 2023-03-03 NOTE — Assessment & Plan Note (Signed)
Denies symptoms today. Recent hospitalization for chest pain ACS was ruled out. Patient has not required nitroglycerin therapy since hospital discharge - Continue monitoring

## 2023-03-03 NOTE — Assessment & Plan Note (Signed)
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 Recent hospitalization for chest pain ACS was ruled out. Patient has not required nitroglycerin therapy since hospital discharge - Continue monitoring

## 2023-03-03 NOTE — Assessment & Plan Note (Signed)
Patient currently on OUD treatment with Suboxone at [facility]. Currently on Suboxone 12-3 mg BID. Per PDMP, patient has been consistently receiving medication from  Colan Neptune, NP, provider at [facility] since March 2024.

## 2023-03-03 NOTE — Progress Notes (Signed)
Patient called and aware of results. No changes to current medications at this time.

## 2023-03-03 NOTE — ED Triage Notes (Signed)
Patient woke up at 0430 am with cold sweats, chills, nausea, feeling short of breath. Patient denies chest pain. Denies pain anywhere.

## 2023-03-06 DIAGNOSIS — F1120 Opioid dependence, uncomplicated: Secondary | ICD-10-CM | POA: Diagnosis not present

## 2023-03-06 DIAGNOSIS — F419 Anxiety disorder, unspecified: Secondary | ICD-10-CM | POA: Diagnosis not present

## 2023-03-22 DIAGNOSIS — F1120 Opioid dependence, uncomplicated: Secondary | ICD-10-CM | POA: Diagnosis not present

## 2023-04-03 DIAGNOSIS — F1120 Opioid dependence, uncomplicated: Secondary | ICD-10-CM | POA: Diagnosis not present

## 2023-04-03 DIAGNOSIS — Z659 Problem related to unspecified psychosocial circumstances: Secondary | ICD-10-CM | POA: Diagnosis not present

## 2023-04-11 ENCOUNTER — Encounter: Payer: Self-pay | Admitting: Neurology

## 2023-04-11 ENCOUNTER — Ambulatory Visit (INDEPENDENT_AMBULATORY_CARE_PROVIDER_SITE_OTHER): Payer: 59 | Admitting: Neurology

## 2023-04-11 VITALS — BP 100/53 | HR 53 | Ht 70.0 in | Wt 265.0 lb

## 2023-04-11 DIAGNOSIS — R351 Nocturia: Secondary | ICD-10-CM

## 2023-04-11 DIAGNOSIS — E669 Obesity, unspecified: Secondary | ICD-10-CM

## 2023-04-11 DIAGNOSIS — Z9189 Other specified personal risk factors, not elsewhere classified: Secondary | ICD-10-CM

## 2023-04-11 DIAGNOSIS — R0681 Apnea, not elsewhere classified: Secondary | ICD-10-CM

## 2023-04-11 DIAGNOSIS — I350 Nonrheumatic aortic (valve) stenosis: Secondary | ICD-10-CM

## 2023-04-11 DIAGNOSIS — R6889 Other general symptoms and signs: Secondary | ICD-10-CM

## 2023-04-11 DIAGNOSIS — F1190 Opioid use, unspecified, uncomplicated: Secondary | ICD-10-CM

## 2023-04-11 DIAGNOSIS — R0683 Snoring: Secondary | ICD-10-CM

## 2023-04-11 DIAGNOSIS — G4752 REM sleep behavior disorder: Secondary | ICD-10-CM

## 2023-04-11 DIAGNOSIS — F1290 Cannabis use, unspecified, uncomplicated: Secondary | ICD-10-CM
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
## 2023-04-11 NOTE — Patient Instructions (Signed)

## 2023-04-18 ENCOUNTER — Telehealth: Payer: Self-pay | Admitting: Neurology

## 2023-04-18 NOTE — Telephone Encounter (Signed)
Sent mychart message to the patient. 

## 2023-04-22 DIAGNOSIS — F1120 Opioid dependence, uncomplicated: Secondary | ICD-10-CM | POA: Diagnosis not present

## 2023-04-27 ENCOUNTER — Ambulatory Visit: Payer: 59 | Admitting: Student

## 2023-04-27 VITALS — BP 109/53 | HR 52 | Temp 98.5°F | Ht 70.0 in | Wt 269.5 lb

## 2023-04-27 DIAGNOSIS — Z23 Encounter for immunization: Secondary | ICD-10-CM | POA: Diagnosis not present

## 2023-04-27 DIAGNOSIS — F418 Other specified anxiety disorders: Secondary | ICD-10-CM

## 2023-04-27 DIAGNOSIS — D735 Infarction of spleen: Secondary | ICD-10-CM

## 2023-04-27 DIAGNOSIS — I10 Essential (primary) hypertension: Secondary | ICD-10-CM | POA: Diagnosis not present

## 2023-04-27 MED ORDER — BUSPIRONE HCL 5 MG PO TABS
5.0000 mg | ORAL_TABLET | Freq: Three times a day (TID) | ORAL | 3 refills | Status: DC
Start: 2023-04-27 — End: 2023-10-27

## 2023-04-27 MED ORDER — GABAPENTIN 300 MG PO CAPS
300.0000 mg | ORAL_CAPSULE | Freq: Three times a day (TID) | ORAL | 2 refills | Status: DC
Start: 2023-04-27 — End: 2023-07-18
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
## 2023-04-27 NOTE — Patient Instructions (Signed)
  Thank you, Mr.Yoon S Noecker, for allowing [name] to provide your care today.  We are not going to make any changes to your medications but if you have any changes in symptoms including worsening mood, thoughts of hurting yourself that are not bleeding, chest pain, shortness of breath, or worsening sleep issues please not hesitate to call us.   Follow up: 2-3 months    Remember:     Should you have any questions or concerns please call the internal medicine clinic at [phone].     Rocky Morel, DO [facility]

## 2023-04-28 NOTE — Assessment & Plan Note (Signed)
Blood pressure 109/53 today.  Patient denies any dizziness, worsening fatigue, palpitations, or other symptoms of low blood pressure at home.  He is on clonidine 0.1 mg twice daily but we are unsure if this is for his blood pressure or substance use disorders.  We will discuss further at his next visit - Continue amlodipine 10 mg daily, losartan 50 mg daily, and clonidine 0.1 mg twice daily

## 2023-04-28 NOTE — Assessment & Plan Note (Signed)
Discussion with patient today reveals that he is doing overall well with stable mood and no further fluctuations.  PHQ-9 score today 22 up from 13 at his last visit.  Discussed suicidal ideation.  He states that he frequently has had fleeting thoughts of being better off not alive but these have been improving and he has not had any plans or deeper thoughts about it.  No current suicidal ideation today.  Despite the PHQ-9 increase he thinks that he is stable and does not want to make any changes at this time. - Continue buspirone 5 mg 3 times daily, gabapentin 300 mg 3 times daily, and fluoxetine 40 mg daily

## 2023-04-28 NOTE — Assessment & Plan Note (Addendum)
At his hospitalization from 02/16/2023 he received pneumococcal vaccine, meningococcal vaccine, and H influenza vaccine.  He is due now for a second dose of meningococcal vaccine.  He is also not gotten his flu shot. - Flu and men ACYW vaccine today

## 2023-05-01 DIAGNOSIS — F1120 Opioid dependence, uncomplicated: Secondary | ICD-10-CM | POA: Diagnosis not present

## 2023-05-01 DIAGNOSIS — Z659 Problem related to unspecified psychosocial circumstances: Secondary | ICD-10-CM | POA: Diagnosis not present

## 2023-05-02 DIAGNOSIS — F1120 Opioid dependence, uncomplicated: Secondary | ICD-10-CM | POA: Diagnosis not present

## 2023-05-02 NOTE — Progress Notes (Signed)
Internal Medicine Clinic Attending  Case discussed with the resident at the time of the visit.  We reviewed the resident's history and exam and pertinent patient test results.  I agree with the assessment, diagnosis, and plan of care documented in the resident's note.  

## 2023-05-09 ENCOUNTER — Telehealth: Payer: Self-pay | Admitting: *Deleted

## 2023-05-09 NOTE — Telephone Encounter (Signed)
From: Ginny Forth"  Sent: 05/07/2023   9:34 AM EDT  To: [facility] Front Desk Pool  Subject: Appointment Request                              I need to schedule an appointment asap please

## 2023-05-09 NOTE — Telephone Encounter (Signed)
I called pt who stated his feet are swollen; started yesterday; difficulty putting on his shoes. Denies having pain and shob. Also c/o of "night terrors" - stated he wakes up screaming sometimes. First available appt given - Thurs 10/10 @ 1315 PM with Dr Merrilee Jansky. Informed pt if his condition worsens to go to the ER - stated he understands.

## 2023-05-11 ENCOUNTER — Encounter: Payer: Self-pay | Admitting: Cardiology

## 2023-05-11 DIAGNOSIS — F1120 Opioid dependence, uncomplicated: Secondary | ICD-10-CM | POA: Diagnosis not present

## 2023-05-12 ENCOUNTER — Encounter: Payer: Self-pay | Admitting: Student

## 2023-05-12 ENCOUNTER — Ambulatory Visit: Payer: 59 | Admitting: Student

## 2023-05-12 VITALS — BP 133/75 | HR 73 | Temp 98.1°F | Ht 70.0 in | Wt 274.0 lb

## 2023-05-12 DIAGNOSIS — I10 Essential (primary) hypertension: Secondary | ICD-10-CM | POA: Diagnosis not present

## 2023-05-12 DIAGNOSIS — R600 Localized edema: Secondary | ICD-10-CM

## 2023-05-12 DIAGNOSIS — Z87891 Personal history of nicotine dependence: Secondary | ICD-10-CM

## 2023-05-12 DIAGNOSIS — F418 Other specified anxiety disorders: Secondary | ICD-10-CM

## 2023-05-12 DIAGNOSIS — K5900 Constipation, unspecified: Secondary | ICD-10-CM

## 2023-05-12 NOTE — Patient Instructions (Signed)
Thank you, Mr.Damen S Tomasello for allowing [name] to provide your care today. Today we discussed .  Swelling of your legs  Raise (elevate) your legs while you are sitting or lying down. Move around often to prevent stiffness and to reduce swelling. Do not sit or stand for long periods of time. Do not wear tight clothing. Do not wear garters on your upper legs. Exercise your legs to get your circulation going. This helps to move the fluid back into your blood vessels, and it may help the swelling go down. Wear compression stockings. These stockings help to prevent blood clots and reduce swelling in your legs. It is important that these are the correct size. These stockings should be prescribed by your doctor to prevent possible injuries. If elastic bandages or wraps are recommended, use them as told by your health care provider.  I have ordered the following labs for you:  Lab Orders  No laboratory test(s) ordered today     Tests ordered today:  None  Referrals ordered today:   Referral Orders         Ambulatory referral to [facility]       I have ordered the following medication/changed the following medications:   Stop the following medications: There are no discontinued medications.   Start the following medications: No orders of the defined types were placed in this encounter.    Follow up:  1-2 months LE swelling and weight     Remember:  Should you have any questions or concerns please call the internal medicine clinic at [phone].     Jeral Pinch, DO [facility]
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
## 2023-05-13 DIAGNOSIS — K5900 Constipation, unspecified: Secondary | ICD-10-CM | POA: Insufficient documentation

## 2023-05-13 DIAGNOSIS — R600 Localized edema: Secondary | ICD-10-CM | POA: Insufficient documentation
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
## 2023-05-13 NOTE — Assessment & Plan Note (Signed)
BP Readings from Last 3 Encounters:  05/12/23 133/75  04/27/23 (!) 109/53  04/11/23 (!) 100/53  Patient is currently on amlodipine 10 mg daily, losartan 50 mg daily, clonidine 0.1 mg twice daily.  No other concerns or symptoms at this time.  Advised to continue current regimen.

## 2023-05-13 NOTE — Assessment & Plan Note (Signed)
Patient reports that he has not had a bowel movement the past 7 days.  Patient reports about 11 days prior he took ExLax that for constipation, and that consequently resulted multiple bouts of stools Friday-Sunday night. Patient is passing flatus.  Patient denies any abdominal pain, nausea, vomiting.  Patient reports he has changed his diet about a week ago, he is only eating varieties of greens and salads.  Patient reports that he does not feel like he needs to go to the bathroom. Patient is advised to use daily MiraLAX for his bowel habits.

## 2023-05-13 NOTE — Assessment & Plan Note (Signed)
Patient has been complaining of worsening night terrors.  Patient was advised to schedule another visit so we can talk about his mental health and likely refer him to a psychiatrist.  Currently continue continue buspirone 5 mg 3 times daily, gabapentin 300 mg 3 times daily and Floxin 40 mg daily. -Referral to behavioral health is sent

## 2023-05-16 NOTE — Progress Notes (Signed)
Internal Medicine Clinic Attending  I was physically present during the key portions of the resident provided service and participated in the medical decision making of patient's management care. I reviewed pertinent patient test results.  The assessment, diagnosis, and plan were formulated together and I agree with the documentation in the resident's note.  Mercie Eon, MD    On my exam, patient did not have any pitting edema or signs of total body volume overload.  He has been on the amlodipine 10mg  daily for a long time, but if he continues to have trouble putting his work boots on, I think we should try decreasing to 5mg  daily.

## 2023-05-18 ENCOUNTER — Ambulatory Visit: Payer: 59 | Admitting: Licensed Clinical Social Worker

## 2023-05-18 DIAGNOSIS — F418 Other specified anxiety disorders: Secondary | ICD-10-CM

## 2023-05-18 NOTE — BH Specialist Note (Signed)
[facility] spoke with the patient today, confirming that they are a [facility] patient and will be rescheduled with a [facility] provider. The patient indicated they are available anytime but prefer appointments after 3 PM on any day. The [facility] has informed the [facility] front desk of the patient's scheduling preferences to facilitate the rescheduling process.  Christen Butter, MSW, LCSW-A She/Her Behavioral Health Clinician [facility]  Internal Medicine Center Direct Dial:[phone]  Fax [phone] Main Office Phone: [phone] [address] [place] [place], [place] [address] Website: [facility] Internal Medicine [facility]  [place], [place]  [place]

## 2023-05-25 ENCOUNTER — Ambulatory Visit: Payer: Self-pay | Admitting: Cardiology

## 2023-05-25 ENCOUNTER — Emergency Department (HOSPITAL_COMMUNITY): Payer: 59

## 2023-05-25 ENCOUNTER — Emergency Department (HOSPITAL_COMMUNITY)
Admission: EM | Admit: 2023-05-25 | Discharge: 2023-05-25 | Discharge status: Against Medical Advice | Payer: 59 | Attending: Emergency Medicine | Admitting: Emergency Medicine

## 2023-05-25 ENCOUNTER — Other Ambulatory Visit: Payer: Self-pay

## 2023-05-25 ENCOUNTER — Telehealth: Payer: Self-pay | Admitting: *Deleted

## 2023-05-25 DIAGNOSIS — Z79899 Other long term (current) drug therapy: Secondary | ICD-10-CM | POA: Insufficient documentation

## 2023-05-25 DIAGNOSIS — R531 Weakness: Secondary | ICD-10-CM | POA: Insufficient documentation

## 2023-05-25 LAB — RAPID URINE DRUG SCREEN, HOSP PERFORMED
Amphetamines: NOT DETECTED
Barbiturates: NOT DETECTED
Benzodiazepines: POSITIVE — AB
Cocaine: NOT DETECTED
Opiates: NOT DETECTED
Tetrahydrocannabinol: POSITIVE — AB

## 2023-05-25 LAB — CBC
HCT: 36.4 % — ABNORMAL LOW (ref 39.0–52.0)
Hemoglobin: 12.4 g/dL — ABNORMAL LOW (ref 13.0–17.0)
MCH: 29.3 pg (ref 26.0–34.0)
MCHC: 34.1 g/dL (ref 30.0–36.0)
MCV: 86.1 fL (ref 80.0–100.0)
Platelets: 237 10*3/uL (ref 150–400)
RBC: 4.23 MIL/uL (ref 4.22–5.81)
RDW: 13.3 % (ref 11.5–15.5)
WBC: 7.5 10*3/uL (ref 4.0–10.5)
nRBC: 0 % (ref 0.0–0.2)

## 2023-05-25 LAB — COMPREHENSIVE METABOLIC PANEL
ALT: 24 U/L (ref 0–44)
AST: 25 U/L (ref 15–41)
Albumin: 3.4 g/dL — ABNORMAL LOW (ref 3.5–5.0)
Alkaline Phosphatase: 72 U/L (ref 38–126)
Anion gap: 10 (ref 5–15)
BUN: 15 mg/dL (ref 6–20)
CO2: 22 mmol/L (ref 22–32)
Calcium: 8.5 mg/dL — ABNORMAL LOW (ref 8.9–10.3)
Chloride: 107 mmol/L (ref 98–111)
Creatinine, Ser: 0.81 mg/dL (ref 0.61–1.24)
GFR, Estimated: >60 mL/min (ref >60)
Glucose, Bld: 92 mg/dL (ref 70–99)
Potassium: 4.1 mmol/L (ref 3.5–5.1)
Sodium: 139 mmol/L (ref 135–145)
Total Bilirubin: 0.5 mg/dL (ref 0.3–1.2)
Total Protein: 6.2 g/dL — ABNORMAL LOW (ref 6.5–8.1)

## 2023-05-25 LAB — URINALYSIS, ROUTINE W REFLEX MICROSCOPIC
Bilirubin Urine: NEGATIVE
Glucose, UA: NEGATIVE mg/dL
Hgb urine dipstick: NEGATIVE
Ketones, ur: NEGATIVE mg/dL
Leukocytes,Ua: NEGATIVE
Nitrite: NEGATIVE
Protein, ur: NEGATIVE mg/dL
Specific Gravity, Urine: 1.019 (ref 1.005–1.030)
pH: 5 (ref 5.0–8.0)

## 2023-05-25 LAB — ETHANOL: Alcohol, Ethyl (B): <10 mg/dL (ref <10)
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
## 2023-05-25 NOTE — ED Notes (Signed)
Pt asking to leave AMA, EDP messaged

## 2023-05-25 NOTE — ED Notes (Signed)
Patient transported to CT 

## 2023-05-25 NOTE — ED Provider Triage Note (Cosign Needed Addendum)
Emergency Medicine Provider Triage Evaluation Note  [name] , a 47 y.o. male  was evaluated in triage.  Pt complains of sleepiness. Pt report he has night terrors and sometimes he doesn't get adequate sleep.  His significant other noted that he appears to be disoriented and slurring his words this AM and wants him to get checked out.  Pt otherwise denies fever, chills, cough, headache, confusion, focal numbness or focal weakness.  Does take suboxone for years, denies drug/alcohol use.  Does not have any active complaint at this time.    Review of Systems  Positive: As above Negative: As above  Physical Exam  BP 102/69   Pulse 81   Temp 98.5 F (36.9 C) (Oral)   Resp 18   SpO2 93%  Gen:   Awake, no distress   Resp:  Normal effort  MSK:   Moves extremities without difficulty  Other:  Neurologic exam:  Speech clear, pupils equal round reactive to light, extraocular movements intact Normal peripheral visual fields Cranial nerves III through XII normal including no facial droop Follows commands, moves all extremities x4, normal strength to bilateral upper and lower extremities at all major muscle groups including grip Sensation normal to light touch and pinprick Coordination intact, no limb ataxia, finger-nose-finger normal Rapid alternating movements normal No pronator drift Gait normal   Medical Decision Making  Medically screening exam initiated at 1:15 PM.  Appropriate orders placed.  Lennox Dayal Tabor was informed that the remainder of the evaluation will be completed by another provider, this initial triage assessment does not replace that evaluation, and the importance of remaining in the ED until their evaluation is complete.       Fayrene Helper, PA-C 05/25/23 1404

## 2023-05-25 NOTE — ED Notes (Signed)
PA Tran at bedside. 

## 2023-05-25 NOTE — ED Provider Triage Note (Deleted)
Emergency Medicine Provider Triage Evaluation Note  [name] , a 47 y.o. male  was evaluated in triage.  Pt complains of stroke sxs. LKN 1 hr ago, pt report speech difficulty, tingling sensation to L side of face and L arm weakness that is steadily improving.  No headache, cp, sob, fever, chills, cough.  Hx of HTN and report recent changes in her BP medications.  No prior stroke, not on blood thinner  Review of Systems  Positive: As above Negative: As above  Physical Exam  BP 102/69   Pulse 81   Temp 98.5 F (36.9 C) (Oral)   Resp 18   SpO2 93%  Gen:   Awake, no distress   Resp:  Normal effort  MSK:   Moves extremities without difficulty  Other:  Neurologic exam:  Speech clear, pupils equal round reactive to light, extraocular movements intact Normal peripheral visual fields Cranial nerves III through XII grossly normal with slight L side facial droop Follows commands, moves all extremities x4, normal strength to bilateral upper and lower extremities at all major muscle groups including grip Sensation normal to light touch and pinprick Coordination intact, no limb ataxia, finger-nose-finger normal Rapid alternating movements normal No pronator drift Gait normal   Medical Decision Making  Medically screening exam initiated at 12:56 PM.  Appropriate orders placed.  [name] was informed that the remainder of the evaluation will be completed by another provider, this initial triage assessment does not replace that evaluation, and the importance of remaining in the ED until their evaluation is complete.  Due to stroke sxs, and mild L side facial droop with LKN an hr ago, I have activated code stroke.  Care discussed with Dr. Freida Busman.     Fayrene Helper, PA-C 05/25/23 1258

## 2023-05-25 NOTE — ED Provider Notes (Signed)
[place] EMERGENCY DEPARTMENT AT [facility] Provider Note   CSN: [id number] Arrival date & time: 05/25/23  1211     History {Add pertinent medical, surgical, social history, OB history to HPI:1} Chief Complaint  Patient presents with   Weakness    [name] is a 47 y.o. male.  He is here with a complaint of feeling heavy in his entire body.  Takes more effort to do anything.  Feels slow in his thinking.  He said symptoms started around 5 AM.  He denies any illness symptoms.  No headache blurry vision numbness weakness chest pain shortness of breath.  He said he is try to eat a little more healthy and increasing his fiber due to being constipated.  No recent medication changes.  No falls or head injury.  Per triage note wife said he has been more lethargic disoriented and slurring his words for 3 days.  Patient is currently on Suboxone  The history is provided by the patient.  Weakness Severity:  Unable to specify Timing:  Constant Progression:  Unchanged Chronicity:  New Context: not alcohol use   Relieved by:  Nothing Worsened by:  Activity Ineffective treatments:  None tried Associated symptoms: no abdominal pain, no chest pain, no diarrhea, no difficulty walking, no falls, no fever, no nausea, no sensory-motor deficit, no shortness of breath and no vomiting        Home Medications Prior to Admission medications   Medication Sig Start Date End Date Taking? Authorizing Provider  acetaminophen (TYLENOL) 325 MG tablet Take 2 tablets (650 mg total) by mouth every 6 (six) hours as needed for mild pain (or Fever >/= 101). 02/17/23   Philomena Doheny, MD  amLODipine (NORVASC) 10 MG tablet Take 1 tablet (10 mg total) by mouth daily. 03/02/23 03/01/24  Morene Crocker, MD  atorvastatin (LIPITOR) 40 MG tablet Take 1 tablet (40 mg total) by mouth daily. 03/02/23 03/01/24  Morene Crocker, MD  Buprenorphine HCl-Naloxone HCl 12-3 MG FILM Place 1 Film  under the tongue 2 (two) times daily.    [provider]  busPIRone (BUSPAR) 5 MG tablet Take 1 tablet (5 mg total) by mouth 3 (three) times daily. 04/27/23   Rocky Morel, DO  cloNIDine (CATAPRES) 0.1 MG tablet Take 1 tablet (0.1 mg total) by mouth 2 (two) times daily. 03/02/23 03/01/24  Morene Crocker, MD  FLUoxetine (PROZAC) 40 MG capsule Take 40 mg by mouth daily.    [provider]  gabapentin (NEURONTIN) 300 MG capsule Take 1 capsule (300 mg total) by mouth 3 (three) times daily. 04/27/23   Rocky Morel, DO  losartan (COZAAR) 50 MG tablet Take 1 tablet (50 mg total) by mouth daily at 10 pm. 03/02/23 03/01/24  Morene Crocker, MD  metoprolol succinate (TOPROL-XL) 25 MG 24 hr tablet Take 1 tablet (25 mg total) by mouth daily. 03/02/23 03/01/24  Morene Crocker, MD  nitroGLYCERIN (NITROSTAT) 0.4 MG SL tablet Place 1 tablet (0.4 mg total) under the tongue every 5 (five) minutes as needed for chest pain. 02/17/23   Philomena Doheny, MD  ondansetron (ZOFRAN-ODT) 8 MG disintegrating tablet Take 1 tablet (8 mg total) by mouth every 8 (eight) hours as needed for nausea or vomiting. 03/03/23   Linwood Dibbles, MD      Allergies    Patient has no known allergies.    Review of Systems   Review of Systems  Constitutional:  Negative for fever.  Eyes:  Negative for visual disturbance.  Respiratory:  Negative for shortness of breath.   Cardiovascular:  Negative for chest pain.  Gastrointestinal:  Negative for abdominal pain, diarrhea, nausea and vomiting.  Musculoskeletal:  Negative for falls.  Neurological:  Positive for weakness.    Physical Exam Updated Vital Signs BP 102/69   Pulse 81   Temp 98.5 F (36.9 C) (Oral)   Resp 18   Ht 5\' 10"  (1.778 m)   Wt 124.7 kg   SpO2 95%   BMI 39.46 kg/m  Physical Exam Vitals and nursing note reviewed.  Constitutional:      General: He is not in acute distress.    Appearance: Normal appearance. He is  well-developed.  HENT:     Head: Normocephalic and atraumatic.  Eyes:     Conjunctiva/sclera: Conjunctivae normal.  Cardiovascular:     Rate and Rhythm: Normal rate and regular rhythm.     Heart sounds: No murmur heard. Pulmonary:     Effort: Pulmonary effort is normal. No respiratory distress.     Breath sounds: Normal breath sounds.  Abdominal:     Palpations: Abdomen is soft.     Tenderness: There is no abdominal tenderness. There is no guarding or rebound.  Musculoskeletal:        General: No swelling.     Cervical back: Neck supple.  Skin:    General: Skin is warm and dry.     Capillary Refill: Capillary refill takes less than 2 seconds.  Neurological:     General: No focal deficit present.     Mental Status: He is alert and oriented to person, place, and time.     Cranial Nerves: No cranial nerve deficit.     Sensory: No sensory deficit.     Motor: No weakness.     ED Results / Procedures / Treatments   Labs (all labs ordered are listed, but only abnormal results are displayed) Labs Reviewed  CBC - Abnormal; Notable for the following components:      Result Value   Hemoglobin 12.4 (*)    HCT 36.4 (*)    All other components within normal limits  RAPID URINE DRUG SCREEN, HOSP PERFORMED - Abnormal; Notable for the following components:   Benzodiazepines POSITIVE (*)    Tetrahydrocannabinol POSITIVE (*)    All other components within normal limits  COMPREHENSIVE METABOLIC PANEL - Abnormal; Notable for the following components:   Calcium 8.5 (*)    Total Protein 6.2 (*)    Albumin 3.4 (*)    All other components within normal limits  URINALYSIS, ROUTINE W REFLEX MICROSCOPIC  ETHANOL  I-STAT CHEM 8, ED  CBG MONITORING, ED    EKG None  Radiology No results found.  Procedures Procedures  {Document cardiac monitor, telemetry assessment procedure when appropriate:1}  Medications Ordered in ED Medications - No data to display  ED Course/ Medical Decision  Making/ A&P   {   Click here for ABCD2, HEART and other calculatorsREFRESH Note before signing :1}                              Medical Decision Making Amount and/or Complexity of Data Reviewed Radiology: ordered.   This patient complains of ***; this involves an extensive number of treatment Options and is a complaint that carries with it a high risk of complications and morbidity. The differential includes ***  I ordered, reviewed and interpreted labs, which included *** I ordered medication ***  and reviewed PMP when indicated. I ordered imaging studies which included *** and I independently    visualized and interpreted imaging which showed *** Additional history obtained from *** Previous records obtained and reviewed *** I consulted *** and discussed lab and imaging findings and discussed disposition.  Cardiac monitoring reviewed, *** Social determinants considered, *** Critical Interventions: ***  After the interventions stated above, I reevaluated the patient and found *** Admission and further testing considered, ***   {Document critical care time when appropriate:1} {Document review of labs and clinical decision tools ie heart score, Chads2Vasc2 etc:1}  {Document your independent review of radiology images, and any outside records:1} {Document your discussion with family members, caretakers, and with consultants:1} {Document social determinants of health affecting pt's care:1} {Document your decision making why or why not admission, treatments were needed:1} Final Clinical Impression(s) / ED Diagnoses Final diagnoses:  None    Rx / DC Orders ED Discharge Orders     None

## 2023-05-25 NOTE — ED Triage Notes (Addendum)
Pt states his wife says he is "off in the distance"; denies pain, denies fevers, denies cough, denies unilateral weakness, denies pain; states "my body feels heavy but that's normal"; spoke with pt's finance [facility]) on phone, who states pt has been lethargic, disoriented, and slurring his words for 3 days;states he has been taking meds as prescribed; pt A and O x 4, denies SI/HI; hx htn, bipolar 1, drug use, currently on suboxone, denies etoh use; pt states he has been having night terrors, difficulty speaking; answering questions appropriately in triage

## 2023-05-26 ENCOUNTER — Encounter: Payer: 59 | Admitting: Student

## 2023-05-26 NOTE — Telephone Encounter (Signed)
Agree with ED evaluation. He has appointment with Dr. Rayvon Char today as well if they did not go yesterday.

## 2023-05-31 ENCOUNTER — Ambulatory Visit: Payer: 59 | Attending: Internal Medicine

## 2023-05-31 ENCOUNTER — Other Ambulatory Visit: Payer: 59

## 2023-05-31 ENCOUNTER — Ambulatory Visit: Payer: 59

## 2023-05-31 DIAGNOSIS — R112 Nausea with vomiting, unspecified: Secondary | ICD-10-CM | POA: Insufficient documentation

## 2023-05-31 DIAGNOSIS — E7800 Pure hypercholesterolemia, unspecified: Secondary | ICD-10-CM | POA: Insufficient documentation

## 2023-05-31 DIAGNOSIS — Q2381 Bicuspid aortic valve: Secondary | ICD-10-CM | POA: Insufficient documentation

## 2023-05-31 DIAGNOSIS — E785 Hyperlipidemia, unspecified: Secondary | ICD-10-CM | POA: Insufficient documentation

## 2023-05-31 DIAGNOSIS — I10 Essential (primary) hypertension: Secondary | ICD-10-CM | POA: Diagnosis present

## 2023-05-31 LAB — ECHOCARDIOGRAM COMPLETE
AR max vel: 1.23 cm2
AV Area VTI: 1.29 cm2
AV Area mean vel: 1.25 cm2
AV Mean grad: 25 mm[Hg]
AV Peak grad: 46 mm[Hg]
Ao pk vel: 3.39 m/s
Area-P 1/2: 3.61 cm2
P 1/2 time: 312 ms
S' Lateral: 2.6 cm

## 2023-06-01 LAB — HEPATIC FUNCTION PANEL
ALT: 16 [IU]/L (ref 0–44)
AST: 20 [IU]/L (ref 0–40)
Albumin: 3.9 g/dL — ABNORMAL LOW (ref 4.1–5.1)
Alkaline Phosphatase: 96 [IU]/L (ref 44–121)
Bilirubin Total: 0.4 mg/dL (ref 0.0–1.2)
Bilirubin, Direct: <0.1 mg/dL (ref 0.00–0.40)
Total Protein: 6.4 g/dL (ref 6.0–8.5)

## 2023-06-01 LAB — LIPID PANEL WITH LDL/HDL RATIO
Cholesterol, Total: 116 mg/dL (ref 100–199)
HDL: 29 mg/dL — ABNORMAL LOW (ref >39)
LDL Chol Calc (NIH): 68 mg/dL (ref 0–99)
LDL/HDL Ratio: 2.3 ratio (ref 0.0–3.6)
Triglycerides: 100 mg/dL (ref 0–149)
VLDL Cholesterol Cal: 19 mg/dL (ref 5–40)

## 2023-06-09 ENCOUNTER — Ambulatory Visit: Payer: 59 | Admitting: Cardiology

## 2023-06-27 ENCOUNTER — Ambulatory Visit (INDEPENDENT_AMBULATORY_CARE_PROVIDER_SITE_OTHER): Payer: 59 | Admitting: Student

## 2023-06-27 VITALS — BP 105/61 | HR 55 | Temp 98.1°F | Ht 70.0 in | Wt 272.7 lb

## 2023-06-27 DIAGNOSIS — F4312 Post-traumatic stress disorder, chronic: Secondary | ICD-10-CM | POA: Diagnosis not present

## 2023-06-27 DIAGNOSIS — Q2381 Bicuspid aortic valve: Secondary | ICD-10-CM

## 2023-06-27 DIAGNOSIS — F515 Nightmare disorder: Secondary | ICD-10-CM | POA: Diagnosis not present

## 2023-06-27 DIAGNOSIS — F418 Other specified anxiety disorders: Secondary | ICD-10-CM

## 2023-06-27 MED ORDER — FLUOXETINE HCL 40 MG PO CAPS: 40.0000 mg | ORAL_CAPSULE | Freq: Every day | ORAL | 11 refills | Status: DC
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
## 2023-06-27 NOTE — Patient Instructions (Addendum)
Thank you, Mr.Izaias S Clift for allowing [name] to provide your care today. Today we discussed   Your recent ED visit, your depression, your heart problems, sleeping disorder and nightmares.    Please follow up with Dr. Jacinto Halim as he continues to monitor your history of aortic root dilation and heart valve problems.  Continue to work on your [employer] with movement and lifestyle modifications we spoke about in clinic Please call your doctors if you feel chest pain or worsening in your shortness of breath  Continue your prozac 40 mg daily For your nightmares - we are referring you to psychiatry to evaluation for medical treatment Please follow up with sleep studies specialist as this could also contributing to your sleeplessness   My Chart Access: [email or web address]  Please follow-up in: 3 months unless symptoms worsen. If you do not receive a call from psychiatry in the next 3 weeks, please call us back to follow up with your referral. See the grounding techniques and the urgent psychiatric medical services below    We look forward to seeing you next time. Please call our clinic at [phone] if you have any questions or concerns. The best time to call is Monday-Friday from 9am-4pm, but there is someone available 24/7. If after hours or the weekend, call the main hospital number and ask for the Internal Medicine Resident On-Call. If you need medication refills, please notify your pharmacy one week in advance and they will send [name] a request.   Thank you for letting us take part in your care. Wishing you the best!  Morene Crocker, MD 06/27/2023, 4:23 PM [facility]

## 2023-06-28 ENCOUNTER — Encounter: Payer: Self-pay | Admitting: Student

## 2023-06-28 DIAGNOSIS — F515 Nightmare disorder: Secondary | ICD-10-CM | POA: Insufficient documentation

## 2023-06-28 DIAGNOSIS — F514 Sleep terrors [night terrors]: Secondary | ICD-10-CM | POA: Insufficient documentation

## 2023-06-28 NOTE — Assessment & Plan Note (Signed)
Since last visit, continues to follow with Dr Jacinto Halim. Last TTE on 05/2023 similar to prior except for progression of aortic insufficiency. He has not experienced worsening of DOE or chest pain. Recent laboratory work up and EKG within his normal parameters. He continues to work on weight loss to pursue aortic valve/aortic root surgery. No volume overloaded on exam today. - Follow up with Dr. Verl Dicker appointment on 08/15/2023

## 2023-06-28 NOTE — Assessment & Plan Note (Signed)
Patient Continues to make improvement in mood symptoms, notably his baseline anxiety. Has remained active and engaged with his support system as well as his specialists for medical care. He is currently on Prozac 40 mg, Buspar 5 mg TID. PHQ9 today slightly improved at 14. He reports persistent insomnia/sleeping terrors that interrupt his sleep every 40 min (see Nightmares tab). He continued to engage in CBT with online provider outside of [facility]. Denies SI/HI today. Feels safe at home otherwise. -Continue current medications -Continue CBT -Return to clinic in 3 months

## 2023-06-28 NOTE — Assessment & Plan Note (Signed)
>>  ASSESSMENT AND PLAN FOR NIGHTMARES WRITTEN ON 06/28/2023  9:21 AM BY GOMEZ-CARABALLO, MARIA, MD  Night terrors that include gunfights, scary thought, people dying that lead patient to trash at night and wake up in sweat. This is different than his usual waking up gasping for air for which he is undergoing evaluation by sleep doctor for suspected OSA. He reports prior history of PTSD not documented on our chart. His dreams are recollections of events of the past that per pateint still haunt him. This patient would benefit from referral to psychiatry and medications like prazosin . Given the nature of our clinic, consistent monitoring and medication assessment for his complex psychiatry history would be best done with psychiatry. Will start urgent referral today -Referral to psychiatry -Precautionary return instructions provided

## 2023-06-28 NOTE — Assessment & Plan Note (Signed)
Night terrors that include gunfights, scary thought, people dying that lead patient to trash at night and wake up in sweat. This is different than his usual waking up gasping for air for which he is undergoing evaluation by sleep doctor for suspected OSA. He reports prior history of PTSD not documented on our chart. His dreams are recollections of events of the past that per pateint still haunt him. This patient would benefit from referral to psychiatry and medications like prazosin. Given the nature of our clinic, consistent monitoring and medication assessment for his complex psychiatry history would be best done with psychiatry. Will start urgent referral today -Referral to psychiatry -Precautionary return instructions provided

## 2023-07-04 NOTE — Progress Notes (Signed)
Internal Medicine Clinic Attending  Case discussed with Dr. Gomez-Caraballo  At the time of the visit.  We reviewed the resident's history and exam and pertinent patient test results.  I agree with the assessment, diagnosis, and plan of care documented in the resident's note.  

## 2023-07-12 DIAGNOSIS — F1120 Opioid dependence, uncomplicated: Secondary | ICD-10-CM | POA: Diagnosis not present

## 2023-07-14 ENCOUNTER — Telehealth: Payer: Self-pay | Admitting: *Deleted

## 2023-07-14 NOTE — Telephone Encounter (Signed)
Patient walked in states that he has started to have some Jerking after restarting gabapentin and Buspar.  Wants to know if this is the cause and what he needs to do.   Last dose was yesterday and has had some jerking today.  Please advise as patient is here with another patient in room 16.

## 2023-07-17 DIAGNOSIS — F1120 Opioid dependence, uncomplicated: Secondary | ICD-10-CM | POA: Diagnosis not present

## 2023-07-18 ENCOUNTER — Ambulatory Visit (INDEPENDENT_AMBULATORY_CARE_PROVIDER_SITE_OTHER): Payer: 59 | Admitting: Student

## 2023-07-18 VITALS — BP 114/68 | HR 64 | Temp 98.4°F | Ht 70.0 in | Wt 276.8 lb

## 2023-07-18 DIAGNOSIS — G249 Dystonia, unspecified: Secondary | ICD-10-CM

## 2023-07-18 DIAGNOSIS — Z0000 Encounter for general adult medical examination without abnormal findings: Secondary | ICD-10-CM

## 2023-07-18 DIAGNOSIS — K5900 Constipation, unspecified: Secondary | ICD-10-CM
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
## 2023-07-18 NOTE — Patient Instructions (Addendum)
Please stop taking Gabapentin as this was causing your jerking movements.  I have sent a referral for your colonoscopy.

## 2023-07-19 ENCOUNTER — Other Ambulatory Visit: Payer: Self-pay | Admitting: Student

## 2023-07-19 DIAGNOSIS — F418 Other specified anxiety disorders: Secondary | ICD-10-CM

## 2023-07-21 DIAGNOSIS — G249 Dystonia, unspecified: Secondary | ICD-10-CM | POA: Insufficient documentation

## 2023-07-21 DIAGNOSIS — Z0000 Encounter for general adult medical examination without abnormal findings: Secondary | ICD-10-CM | POA: Insufficient documentation

## 2023-07-21 NOTE — Assessment & Plan Note (Addendum)
Patient endorsing "jerky" movements after starting Gabapentin. No prior history with this. Movements characterized by bilateral upper/lower extremity jerks that occur a few times/day. He stopped taking the Gabapentin a few days ago and irregular movements have stopped. Patient is not on any medications with close association with tardive dyskinesia. Given improved symptoms and unremarkable physical exam, attributing dystonia to the known side-effect of Gabapentin - this is now added to his allergy list. Patient instructed to call if movements return.

## 2023-07-21 NOTE — Addendum Note (Signed)
Addended by: Derrek Monaco on: 07/21/2023 11:47 AM   Modules accepted: Level of Service

## 2023-07-21 NOTE — Assessment & Plan Note (Signed)
Patient is overdue for colonoscopy. GI referral sent.

## 2023-07-21 NOTE — Progress Notes (Signed)
Internal Medicine Clinic Attending  I was physically present during the key portions of the resident provided service and participated in the medical decision making of patient's management care. I reviewed pertinent patient test results.  The assessment, diagnosis, and plan were formulated together and I agree with the documentation in the resident's note.  Mercie Eon, MD    Today we addressed patient's depression & anxiety, which had previously been treated with gabapentin. Due to side effects of gabapentin, we stopped this medicine.

## 2023-08-02 DIAGNOSIS — F1120 Opioid dependence, uncomplicated: Secondary | ICD-10-CM | POA: Diagnosis not present

## 2023-08-04 ENCOUNTER — Other Ambulatory Visit: Payer: Self-pay

## 2023-08-04 DIAGNOSIS — E7800 Pure hypercholesterolemia, unspecified: Secondary | ICD-10-CM

## 2023-08-04 MED ORDER — ATORVASTATIN CALCIUM 40 MG PO TABS: 40.0000 mg | ORAL_TABLET | Freq: Every day | ORAL | 1 refills | Status: DC
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
## 2023-08-15 ENCOUNTER — Encounter: Payer: Self-pay | Admitting: Cardiology

## 2023-08-15 ENCOUNTER — Ambulatory Visit: Payer: 59 | Attending: Cardiology | Admitting: Cardiology

## 2023-08-15 VITALS — BP 128/74 | HR 76 | Resp 16 | Ht 70.0 in | Wt 277.0 lb

## 2023-08-15 DIAGNOSIS — I7121 Aneurysm of the ascending aorta, without rupture: Secondary | ICD-10-CM | POA: Diagnosis not present

## 2023-08-15 DIAGNOSIS — Q2381 Bicuspid aortic valve: Secondary | ICD-10-CM

## 2023-08-15 DIAGNOSIS — I10 Essential (primary) hypertension: Secondary | ICD-10-CM

## 2023-08-15 NOTE — Progress Notes (Signed)
 Cardiology Office Note:  .   Date:  08/15/2023  ID:  [name], DOB 1975-08-22, MRN [id number] PCP: Marylu Gee, DO  [facility] Providers Cardiologist:  Gordy Bergamo, MD   History of Present Illness: .   [name] is a 48 y.o. Caucasian male with prior tobacco use disorder 10-pack-year history of smoking quit smoking in 2022, bicuspid aortic valve with mild to moderate aortic stenosis, primary hypertension, bipolar disorder, chronic back pain and anxiety and depression. On his last office visit, I had started him on atorvastatin  40 mg daily and obtain lipid profile and also referred him for GI follow-up persistent nausea and vomiting-sleep studies.  He now presents for follow-up  Discussed the use of AI scribe software for clinical note transcription with the patient, who gave verbal consent to proceed.  History of Present Illness   The patient, with a history of hyperlipidemia on atorvastatin , presents with new onset memory loss and balance issues over the past two weeks. He describes feeling 'not quite all there' and 'very forgetful.' He denies any associated pain. The patient's companion corroborates these symptoms and expresses concern. The patient also reports ongoing nausea and has upcoming appointments with gastroenterology and sleep medicine. He admits to a diet high in sugar but denies a diagnosis of diabetes.      Labs   Lab Results  Component Value Date   CHOL 116 05/31/2023   HDL 29 (L) 05/31/2023   LDLCALC 68 05/31/2023   TRIG 100 05/31/2023   CHOLHDL 6.3 01/11/2023   Lab Results  Component Value Date   NA 139 05/25/2023   K 4.1 05/25/2023   CO2 22 05/25/2023   GLUCOSE 92 05/25/2023   BUN 15 05/25/2023   CREATININE 0.81 05/25/2023   CALCIUM  8.5 (L) 05/25/2023   EGFR 110 03/02/2023   GFRNONAA >60 05/25/2023      Latest Ref Rng & Units 05/25/2023   12:38 PM 03/03/2023    6:25 AM 03/03/2023    5:34 AM  BMP  Glucose 70 - 99 mg/dL 92   837   BUN  6 - 20 mg/dL 15   12   Creatinine 9.38 - 1.24 mg/dL 9.18   9.06   Sodium 864 - 145 mmol/L 139  140  140   Potassium 3.5 - 5.1 mmol/L 4.1  3.4  3.5   Chloride 98 - 111 mmol/L 107   100   CO2 22 - 32 mmol/L 22   22   Calcium  8.9 - 10.3 mg/dL 8.5   9.4       Latest Ref Rng & Units 05/25/2023   12:38 PM 03/03/2023    6:25 AM 03/03/2023    5:34 AM  CBC  WBC 4.0 - 10.5 K/uL 7.5   12.0   Hemoglobin 13.0 - 17.0 g/dL 87.5  85.6  83.1   Hematocrit 39.0 - 52.0 % 36.4  42.0  48.7   Platelets 150 - 400 K/uL 237   302    External Labs:  Review of Systems  Cardiovascular:  Negative for chest pain, dyspnea on exertion and leg swelling.    Physical Exam:   VS:  BP 128/74 (BP Location: Left Arm, Patient Position: Sitting, Cuff Size: Large)   Pulse 76   Resp 16   Ht 5' 10 (1.778 m)   Wt 277 lb (125.6 kg)   SpO2 96%   BMI 39.75 kg/m    Wt Readings from Last 3 Encounters:  08/15/23 277 lb (  125.6 kg)  07/18/23 276 lb 12.8 oz (125.6 kg)  06/27/23 272 lb 11.2 oz (123.7 kg)     Physical Exam Constitutional:      Appearance: He is obese.  Neck:     Vascular: Carotid bruit (bilateral) present. No JVD.  Cardiovascular:     Rate and Rhythm: Normal rate and regular rhythm.     Pulses: Intact distal pulses.     Heart sounds: S1 normal and S2 normal. Murmur heard.     Harsh midsystolic murmur is present with a grade of 2/6 at the upper right sternal border radiating to the neck.     No gallop.  Pulmonary:     Effort: Pulmonary effort is normal.     Breath sounds: Normal breath sounds.  Abdominal:     General: Bowel sounds are normal.     Palpations: Abdomen is soft.  Musculoskeletal:     Right lower leg: No edema.     Left lower leg: No edema.     Studies Reviewed: SABRA    Coronary angiogram 09/24/2020:   Carotid artery duplex 06/02/2022:  Duplex suggests stenosis in the right internal carotid artery (minimal).  Duplex suggests stenosis in the left internal carotid artery (minimal).   Antegrade right vertebral artery flow. Antegrade left vertebral artery flow.  Compared to 06/04/2021, previously noted bilateral mild carotid stenosis not present.  Essentially normal study.   CT angio chest, abdomen and pelvis for dissection 02/16/2023: No evidence of aortic dissection. Aortic valve calcification is evident.  4.2 cm ascending thoracic aortic aneurysm, recommend annual screening.  No coronary calcification evident. Peripheral subcapsular focus of hypoperfusion in the dome of the spleen, suggest splenic infarct. Hepatic steatosis.  Echocardiogram 05/31/2023:  1. Left ventricular ejection fraction, by estimation, is 65 to 70%. The left ventricle has normal function. The left ventricle has no regional wall motion abnormalities. There is mild concentric left ventricular hypertrophy. Left ventricular diastolic  parameters are consistent with Grade II diastolic dysfunction (pseudonormalization).  2. Right ventricular systolic function is normal. The right ventricular size is normal. Tricuspid regurgitation signal is inadequate for assessing PA pressure.  3. Left atrial size was moderately dilated.  4. The mitral valve is normal in structure. No evidence of mitral valve regurgitation. No evidence of mitral stenosis.  5. The aortic valve is bicuspid. There is moderate calcification of the aortic valve. There is moderate thickening of the aortic valve. Aortic valve regurgitation is moderate. Moderate aortic valve stenosis. Aortic regurgitation PHT measures 312 msec.  Aortic valve mean gradient measures 25.0 mmHg. Aortic valve Vmax measures 3.39 m/s. Aortic valve acceleration time measures 113 msec.  6. There is borderline dilatation of the ascending aorta, measuring 39 mm.   Comparison(s): Prior images reviewed side by side. There is little change in the degree of aortic stenosis, but the aortic insufficiency appears more severe than previously reported. EKG:        EKG 02/22/2023:  Sinus bradycardia at the rate of 59 bpm, leftward enlargement, normal axis.  IVCD, borderline criteria for LVH.  Compared to 06/09/2022, no significant change  Medications and allergies    Allergies  Allergen Reactions   Gabapentin      Jerky movements of extremities, unclear if true allergy     Current Outpatient Medications:    acetaminophen  (TYLENOL ) 325 MG tablet, Take 2 tablets (650 mg total) by mouth every 6 (six) hours as needed for mild pain (or Fever >/= 101)., Disp: 30 tablet, Rfl: 0   amLODipine  (  NORVASC ) 10 MG tablet, Take 1 tablet (10 mg total) by mouth daily., Disp: 30 tablet, Rfl: 1   atorvastatin  (LIPITOR ) 40 MG tablet, Take 1 tablet (40 mg total) by mouth daily., Disp: 90 tablet, Rfl: 1   Buprenorphine  HCl-Naloxone  HCl 12-3 MG FILM, Place 1 Film under the tongue 2 (two) times daily., Disp: , Rfl:    busPIRone  (BUSPAR ) 5 MG tablet, Take 1 tablet (5 mg total) by mouth 3 (three) times daily., Disp: 90 tablet, Rfl: 3   cloNIDine  (CATAPRES ) 0.1 MG tablet, Take 1 tablet (0.1 mg total) by mouth 2 (two) times daily., Disp: 60 tablet, Rfl: 11   FLUoxetine  (PROZAC ) 40 MG capsule, TAKE 1 CAPSULE (40 MG TOTAL) BY MOUTH DAILY., Disp: 90 capsule, Rfl: 4   losartan  (COZAAR ) 50 MG tablet, Take 1 tablet (50 mg total) by mouth daily at 10 pm., Disp: 30 tablet, Rfl: 1   metoprolol  succinate (TOPROL -XL) 25 MG 24 hr tablet, Take 1 tablet (25 mg total) by mouth daily., Disp: 30 tablet, Rfl: 11   nitroGLYCERIN  (NITROSTAT ) 0.4 MG SL tablet, Place 1 tablet (0.4 mg total) under the tongue every 5 (five) minutes as needed for chest pain., Disp: 30 tablet, Rfl: 0   ondansetron  (ZOFRAN -ODT) 8 MG disintegrating tablet, Take 1 tablet (8 mg total) by mouth every 8 (eight) hours as needed for nausea or vomiting., Disp: 12 tablet, Rfl: 0   ASSESSMENT AND PLAN: .      ICD-10-CM   1. Bicuspid aortic valve  Q23.81     2. Aneurysm of ascending aorta without rupture (HCC)  I71.21     3. Essential hypertension   I10       1. Bicuspid aortic valve Patient has bicuspid aortic valve and aortic aneurysm, needs aggressive risk modification including control of hypertension and also follow-up monitoring of aortic root dilatation.  Patient is aware of risk of aortic aneurysm.  2. Aneurysm of ascending aorta without rupture (HCC) As discussed above.  3. Essential hypertension Blood pressure is well-controlled.     Memory Loss and Balance Issues Recent onset over the past two weeks. Possible side effect of Atorvastatin . -Hold Atorvastatin  for two months and monitor for improvement in memory and balance. -If symptoms persist, consider evaluation by a neurologist. -If symptoms improve, discuss with PCP about starting Crestor  for cholesterol management.  Hyperlipidemia Currently managed with Atorvastatin , which may be causing memory loss and balance issues. -Hold Atorvastatin  for two months. -If memory and balance issues improve, discuss with PCP about starting Crestor .  Gastrointestinal Issues Patient reports ongoing nausea. Appointment with gastroenterologist scheduled for later this year. -Keep appointment with gastroenterologist.  Sleep Issues Patient awaiting sleep study. -Keep appointment for sleep study.  Lifestyle/Diet Patient reports high sugar intake and smoking. Patient's companion is diabetic. -Advise patient to reduce sugar intake and consider smoking cessation. -Advise patient's companion to manage diabetes and consider cardiac evaluation due to high risk factors (diabetes and smoking).  Follow-up in six months.            Signed,  Gordy Bergamo, MD, [facility] 08/15/2023, 9:21 PM [facility] [address] [place], [place] [address] Phone: [phone]. Fax:  [phone]

## 2023-08-15 NOTE — Patient Instructions (Addendum)
 Please hold taking atorvastatin  for the next 2 months.  If you are forgetfulness improves, do not start the medication again.  However even in spite of stopping the medication for a month to 2 months, if his symptoms persist, please restart atorvastatin  as it has improved your cholesterol, please discuss with Dr. Norman Lobstein, your PCP regarding forgetfulness and may need further evaluation.  However if your memory does improve after stopping Lipitor , you still need to be on a cholesterol medication, please request your PCP to prescribe you Crestor  20 mg daily.  Please keep the appointment with GI doctors and also sleep medicine.  I will see you back in 6 months.

## 2023-08-22 ENCOUNTER — Other Ambulatory Visit: Payer: Self-pay

## 2023-08-22 MED ORDER — AMLODIPINE BESYLATE 10 MG PO TABS: 10.0000 mg | ORAL_TABLET | Freq: Every day | ORAL | 3 refills | Status: DC

## 2023-08-23 DIAGNOSIS — F1120 Opioid dependence, uncomplicated: Secondary | ICD-10-CM | POA: Diagnosis not present

## 2023-08-28 DIAGNOSIS — F1120 Opioid dependence, uncomplicated: Secondary | ICD-10-CM | POA: Diagnosis not present

## 2023-09-02 ENCOUNTER — Other Ambulatory Visit: Payer: Self-pay | Admitting: Student

## 2023-09-14 ENCOUNTER — Ambulatory Visit (INDEPENDENT_AMBULATORY_CARE_PROVIDER_SITE_OTHER): Payer: 59 | Admitting: Student

## 2023-09-14 VITALS — BP 145/76 | HR 72 | Temp 98.0°F | Ht 70.0 in | Wt 273.9 lb

## 2023-09-14 DIAGNOSIS — F3341 Major depressive disorder, recurrent, in partial remission: Secondary | ICD-10-CM | POA: Diagnosis not present

## 2023-09-14 DIAGNOSIS — Z0000 Encounter for general adult medical examination without abnormal findings: Secondary | ICD-10-CM

## 2023-09-14 DIAGNOSIS — E7800 Pure hypercholesterolemia, unspecified: Secondary | ICD-10-CM

## 2023-09-14 DIAGNOSIS — F418 Other specified anxiety disorders: Secondary | ICD-10-CM | POA: Diagnosis not present

## 2023-09-14 DIAGNOSIS — I7121 Aneurysm of the ascending aorta, without rupture: Secondary | ICD-10-CM | POA: Diagnosis not present

## 2023-09-14 DIAGNOSIS — I10 Essential (primary) hypertension: Secondary | ICD-10-CM | POA: Diagnosis not present

## 2023-09-14 MED ORDER — ATORVASTATIN CALCIUM 40 MG PO TABS: 40.0000 mg | ORAL_TABLET | Freq: Every day | ORAL | 3 refills | Status: DC

## 2023-09-14 MED ORDER — LOSARTAN POTASSIUM 50 MG PO TABS: 50.0000 mg | ORAL_TABLET | Freq: Every day | ORAL | 4 refills | Status: DC

## 2023-09-14 MED ORDER — METOPROLOL SUCCINATE ER 25 MG PO TB24: 25.0000 mg | ORAL_TABLET | Freq: Every day | ORAL | 3 refills | Status: AC

## 2023-09-14 MED ORDER — AMLODIPINE BESYLATE 10 MG PO TABS: 10.0000 mg | ORAL_TABLET | Freq: Every day | ORAL | 3 refills | Status: DC

## 2023-09-14 MED ORDER — FLUOXETINE HCL 40 MG PO CAPS: 40.0000 mg | ORAL_CAPSULE | Freq: Every day | ORAL | 3 refills | Status: DC

## 2023-09-14 MED ORDER — CLONIDINE HCL 0.1 MG PO TABS: 0.1000 mg | ORAL_TABLET | Freq: Two times a day (BID) | ORAL | 11 refills | Status: DC

## 2023-09-14 NOTE — Assessment & Plan Note (Signed)
Patient Continues to make improvement in mood symptoms, notably his baseline anxiety. Has remained active and engaged with his support system as well as his specialists for medical care. He is currently on Prozac 40 mg, Buspar 5 mg TID. PHQ9 today slightly improved at 14. He reports persistent insomnia/sleeping terrors that interrupt his sleep every 40 min (see Nightmares tab). He continued to engage in CBT with online provider outside of [facility]. Denies SI/HI today. Feels safe at home otherwise. -Continue current medications -Continue CBT -Return to clinic in 3 months

## 2023-09-14 NOTE — Assessment & Plan Note (Signed)
Referral sent for colonoscopy. Hep. C screening today Tdap today
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
## 2023-09-14 NOTE — Assessment & Plan Note (Addendum)
Patient is currently on amlodipine 10 mg daily, losartan 50 mg daily, Toprol 25 mg daily. Stopped taking Clonidine two weeks ago as he was told from another provider that it may be contributing to chest tightness during panic attacks. This has not improved after holding Clonidine. Will resume Clonidine 0.1 mg twice daily. No other concerns or symptoms at this time.

## 2023-09-14 NOTE — Patient Instructions (Signed)
I have sent refills to your pharmacy. Please resume clonidine as prescribed.  Follow-up in 6 months.

## 2023-09-15 LAB — HCV AB W REFLEX TO QUANT PCR: HCV Ab: NONREACTIVE

## 2023-09-15 LAB — HCV INTERPRETATION

## 2023-09-15 NOTE — Assessment & Plan Note (Signed)
Aortic valve calcification evident with 4.2 cm ascending thoracic aortic aneurysm as noted on 01/2023 CT. He is following with Dr. Jacinto Halim with Cardiology. Plan is for annual imaging.

## 2023-09-19 NOTE — Progress Notes (Signed)
 Internal Medicine Clinic Attending  Case discussed with the resident at the time of the visit.  We reviewed the resident's history and exam and pertinent patient test results.  I agree with the assessment, diagnosis, and plan of care documented in the resident's note.

## 2023-10-07 ENCOUNTER — Ambulatory Visit (HOSPITAL_BASED_OUTPATIENT_CLINIC_OR_DEPARTMENT_OTHER): Payer: Self-pay | Admitting: Psychiatry

## 2023-10-07 ENCOUNTER — Encounter (HOSPITAL_COMMUNITY): Payer: Self-pay

## 2023-10-07 DIAGNOSIS — Z91199 Patient's noncompliance with other medical treatment and regimen due to unspecified reason: Secondary | ICD-10-CM

## 2023-10-07 NOTE — Progress Notes (Signed)
Patient no show for appointment.

## 2023-10-25 ENCOUNTER — Other Ambulatory Visit: Payer: Self-pay

## 2023-10-25 ENCOUNTER — Encounter: Payer: Self-pay | Admitting: Internal Medicine

## 2023-10-25 ENCOUNTER — Emergency Department (HOSPITAL_COMMUNITY)
Admission: EM | Admit: 2023-10-25 | Discharge: 2023-10-25 | Disposition: A | Discharge status: Home / Self Care | Payer: PRIVATE HEALTH INSURANCE | Attending: Emergency Medicine | Admitting: Emergency Medicine

## 2023-10-25 DIAGNOSIS — I10 Essential (primary) hypertension: Secondary | ICD-10-CM | POA: Insufficient documentation

## 2023-10-25 DIAGNOSIS — G4700 Insomnia, unspecified: Secondary | ICD-10-CM | POA: Insufficient documentation

## 2023-10-25 DIAGNOSIS — R0789 Other chest pain: Secondary | ICD-10-CM | POA: Insufficient documentation

## 2023-10-25 DIAGNOSIS — Z79899 Other long term (current) drug therapy: Secondary | ICD-10-CM | POA: Insufficient documentation

## 2023-10-25 DIAGNOSIS — Z87891 Personal history of nicotine dependence: Secondary | ICD-10-CM | POA: Diagnosis not present

## 2023-10-25 LAB — URINALYSIS, ROUTINE W REFLEX MICROSCOPIC
Bilirubin Urine: NEGATIVE
Glucose, UA: NEGATIVE mg/dL
Hgb urine dipstick: NEGATIVE
Ketones, ur: NEGATIVE mg/dL
Leukocytes,Ua: NEGATIVE
Nitrite: NEGATIVE
Protein, ur: NEGATIVE mg/dL
Specific Gravity, Urine: 1.019 (ref 1.005–1.030)
pH: 6 (ref 5.0–8.0)

## 2023-10-25 LAB — COMPREHENSIVE METABOLIC PANEL
ALT: 20 U/L (ref 0–44)
AST: 26 U/L (ref 15–41)
Albumin: 4 g/dL (ref 3.5–5.0)
Alkaline Phosphatase: 64 U/L (ref 38–126)
Anion gap: 11 (ref 5–15)
BUN: 15 mg/dL (ref 6–20)
CO2: 24 mmol/L (ref 22–32)
Calcium: 9 mg/dL (ref 8.9–10.3)
Chloride: 104 mmol/L (ref 98–111)
Creatinine, Ser: 0.79 mg/dL (ref 0.61–1.24)
GFR, Estimated: >60 mL/min (ref >60)
Glucose, Bld: 133 mg/dL — ABNORMAL HIGH (ref 70–99)
Potassium: 3.7 mmol/L (ref 3.5–5.1)
Sodium: 139 mmol/L (ref 135–145)
Total Bilirubin: 0.5 mg/dL (ref 0.0–1.2)
Total Protein: 7.1 g/dL (ref 6.5–8.1)

## 2023-10-25 LAB — ACETAMINOPHEN LEVEL: Acetaminophen (Tylenol), Serum: 22 ug/mL (ref 10–30)

## 2023-10-25 LAB — TROPONIN I (HIGH SENSITIVITY)
Troponin I (High Sensitivity): 6 ng/L (ref <18)
Troponin I (High Sensitivity): 10 ng/L (ref <18)

## 2023-10-25 LAB — CBC WITH DIFFERENTIAL/PLATELET
Abs Immature Granulocytes: 0.07 10*3/uL (ref 0.00–0.07)
Basophils Absolute: 0 10*3/uL (ref 0.0–0.1)
Basophils Relative: 1 %
Eosinophils Absolute: 0.3 10*3/uL (ref 0.0–0.5)
Eosinophils Relative: 3 %
HCT: 40.6 % (ref 39.0–52.0)
Hemoglobin: 14.1 g/dL (ref 13.0–17.0)
Immature Granulocytes: 1 %
Lymphocytes Relative: 25 %
Lymphs Abs: 2 10*3/uL (ref 0.7–4.0)
MCH: 28.7 pg (ref 26.0–34.0)
MCHC: 34.7 g/dL (ref 30.0–36.0)
MCV: 82.5 fL (ref 80.0–100.0)
Monocytes Absolute: 0.5 10*3/uL (ref 0.1–1.0)
Monocytes Relative: 6 %
Neutro Abs: 5.1 10*3/uL (ref 1.7–7.7)
Neutrophils Relative %: 64 %
Platelets: 173 10*3/uL (ref 150–400)
RBC: 4.92 MIL/uL (ref 4.22–5.81)
RDW: 13.3 % (ref 11.5–15.5)
WBC: 7.9 10*3/uL (ref 4.0–10.5)
nRBC: 0 % (ref 0.0–0.2)

## 2023-10-25 LAB — SALICYLATE LEVEL: Salicylate Lvl: 7.4 mg/dL (ref 7.0–30.0)

## 2023-10-25 LAB — ETHANOL: Alcohol, Ethyl (B): <10 mg/dL (ref <10)

## 2023-10-25 LAB — I-STAT VENOUS BLOOD GAS, ED
Acid-Base Excess: 0 mmol/L (ref 0.0–2.0)
Bicarbonate: 25.9 mmol/L (ref 20.0–28.0)
Calcium, Ion: 1.11 mmol/L — ABNORMAL LOW (ref 1.15–1.40)
HCT: 41 % (ref 39.0–52.0)
Hemoglobin: 13.9 g/dL (ref 13.0–17.0)
O2 Saturation: 100 %
Potassium: 3.5 mmol/L (ref 3.5–5.1)
Sodium: 139 mmol/L (ref 135–145)
TCO2: 27 mmol/L (ref 22–32)
pCO2, Ven: 44.2 mmHg (ref 44–60)
pH, Ven: 7.376 (ref 7.25–7.43)
pO2, Ven: 187 mmHg — ABNORMAL HIGH (ref 32–45)

## 2023-10-25 LAB — RAPID URINE DRUG SCREEN, HOSP PERFORMED
Amphetamines: NOT DETECTED
Barbiturates: NOT DETECTED
Benzodiazepines: NOT DETECTED
Cocaine: NOT DETECTED
Opiates: NOT DETECTED
Tetrahydrocannabinol: POSITIVE — AB

## 2023-10-25 MED ORDER — ONDANSETRON HCL 4 MG/2ML IJ SOLN
4.0000 mg | Freq: Once | INTRAMUSCULAR | Status: AC
  Administered 2023-10-25: 4 mg via INTRAVENOUS
  Filled 2023-10-25: qty 2

## 2023-10-25 MED ORDER — SODIUM CHLORIDE 0.9 % IV BOLUS
1000.0000 mL | Freq: Once | INTRAVENOUS | Status: AC
  Administered 2023-10-25: 1000 mL via INTRAVENOUS

## 2023-10-25 NOTE — ED Triage Notes (Signed)
 Patient stated about midnight he started feeling like his ceiling was moving, and something was going on with his inside. Patient states he see faces where he normal don't see faces. Patient denies any auditory hallucinations. Patient states he can also taste copper that started 24 hours ago.
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
## 2023-10-25 NOTE — Discharge Instructions (Addendum)
 While you were in the emergency room, you had blood work done that was normal.  Based on your symptoms, I think focusing on improving your sleep habits is the best thing to do.  This means cutting down on things like caffeine, nicotine, television before bed.  I would encourage you to follow-up with your primary care doctor within 2 weeks to discuss the symptoms.  I would also encourage you to make your neck scheduled follow-up appointment with your psychiatrist.

## 2023-10-27 ENCOUNTER — Ambulatory Visit: Payer: PRIVATE HEALTH INSURANCE | Admitting: Student

## 2023-10-27 VITALS — BP 140/76 | HR 81 | Temp 98.5°F | Ht 70.0 in | Wt 285.3 lb

## 2023-10-27 DIAGNOSIS — K5900 Constipation, unspecified: Secondary | ICD-10-CM

## 2023-10-27 DIAGNOSIS — F515 Nightmare disorder: Secondary | ICD-10-CM | POA: Diagnosis not present

## 2023-10-27 MED ORDER — PRAZOSIN HCL 1 MG PO CAPS: 1.0000 mg | ORAL_CAPSULE | Freq: Every day | ORAL | 2 refills | Status: DC

## 2023-10-27 MED ORDER — SENNA 8.6 MG PO TABS: 1.0000 | ORAL_TABLET | Freq: Every day | ORAL | 2 refills | Status: AC
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
## 2023-10-27 NOTE — Patient Instructions (Addendum)
 Thank you so much for coming to the clinic today!   For your prazosin:  - Take 1 mg at bedtime; after 2 to 3 days increase dose to 2 mg at bedtime  I have also placed in a referral for therapy with [facility].  For your constipation, please add one tablet of the Senna to your current medications.   If you have any questions please feel free to the call the clinic at anytime at [phone]. It was a pleasure seeing you!  Best, Dr. Rayvon Char

## 2023-10-28 ENCOUNTER — Ambulatory Visit (AMBULATORY_SURGERY_CENTER): Payer: Self-pay

## 2023-10-28 VITALS — Ht 71.0 in | Wt 280.0 lb

## 2023-10-28 DIAGNOSIS — Z1211 Encounter for screening for malignant neoplasm of colon: Secondary | ICD-10-CM

## 2023-10-28 MED ORDER — SUFLAVE 178.7 G PO SOLR: 1.0000 | ORAL | 0 refills | Status: DC

## 2023-10-28 NOTE — Assessment & Plan Note (Signed)
>>  ASSESSMENT AND PLAN FOR NIGHTMARES WRITTEN ON 10/28/2023  9:11 AM BY STEPHANIE FREUND, MD  Patient has been seen in the clinic in the past for night terrors.  He reports a prior history of PTSD.  He was scheduled to see psychiatry on 10/07/2023 but was unable to attend due to work.  For the past 3 days, he feels like his nightmares have worsened.  He feels like someone is shaking a change jar in his head.  His significant other notes screaming/yelling at night and reaching for things that are not there.  He also notes seeing figures at night, as well as faces.  This has progressively worsened over the past 3 to 4 weeks.  He notes that he is scheduled for a psychiatry follow-up on 11/13/2023.  In the meantime, we will send in a referral to [facility] and start him on prazosin . - Integrative behavioral health referral to [facility] - Prazosin  (1 mg at bedtime; after 2 to 3 days increase dose to 2 mg at bedtime)

## 2023-10-28 NOTE — Assessment & Plan Note (Signed)
 Notes having issues passing his stool.  His stools are hard, but he denies any blood or discoloration.  This is likely secondary to the buprenorphine-naloxone.  He has tried MiraLAX at home.  Will supplement with senna. - Senna 1 tablet by mouth daily

## 2023-10-28 NOTE — Assessment & Plan Note (Signed)
 Patient has been seen in the clinic in the past for night terrors.  He reports a prior history of PTSD.  He was scheduled to see psychiatry on 10/07/2023 but was unable to attend due to work.  For the past 3 days, he feels like his nightmares have worsened.  He feels like someone is shaking a change jar in his head.  His significant other notes screaming/yelling at night and reaching for things that are not there.  He also notes seeing figures at night, as well as faces.  This has progressively worsened over the past 3 to 4 weeks.  He notes that he is scheduled for a psychiatry follow-up on 11/13/2023.  In the meantime, we will send in a referral to [facility] and start him on prazosin. - Integrative behavioral health referral to [facility] - Prazosin (1 mg at bedtime; after 2 to 3 days increase dose to 2 mg at bedtime)

## 2023-10-28 NOTE — Progress Notes (Signed)
 Internal Medicine Clinic Attending  Case discussed with the resident at the time of the visit.  We reviewed the resident's history and exam and pertinent patient test results.  I agree with the assessment, diagnosis, and plan of care documented in the resident's note.

## 2023-10-28 NOTE — Progress Notes (Signed)
 No egg or soy allergy known to patient  No issues known to pt with past sedation with any surgeries or procedures Patient denies ever being told they had issues or difficulty with intubation  No FH of Malignant Hyperthermia Pt is not on diet pills Pt is not on  home 02  Pt is not on blood thinners  Pt reports constipation  No A fib or A flutter Have any cardiac testing pending-- no  LOA: independent  Prep: suflave   Patient's chart reviewed by Cathlyn Parsons CNRA prior to previsit and patient appropriate for the [facility].  Previsit completed and red dot placed by patient's name on their procedure day (on provider's schedule).     PV completed with patient. Prep instructions sent via mychart and home address.

## 2023-10-31 ENCOUNTER — Telehealth: Payer: Self-pay | Admitting: *Deleted

## 2023-10-31 ENCOUNTER — Telehealth: Payer: Self-pay

## 2023-10-31 DIAGNOSIS — F3341 Major depressive disorder, recurrent, in partial remission: Secondary | ICD-10-CM

## 2023-10-31 NOTE — Progress Notes (Signed)
 Complex Care Management Note  Care Guide Note 10/31/2023 Name: [name] MRN: [id number] DOB: 1976/01/06  [name] is a 48 y.o. year old male who sees [name], [place] for primary care. I reached out to [name] by phone today to offer complex care management services.  [name] was given information about Complex Care Management services today including:   The Complex Care Management services include support from the care team which includes your Nurse Care Manager, Clinical Social Worker, or Pharmacist.  The Complex Care Management team is here to help remove barriers to the health concerns and goals most important to you. Complex Care Management services are voluntary, and the patient may decline or stop services at any time by request to their care team member.   Complex Care Management Consent Status: Patient agreed to services and verbal consent obtained.   Follow up plan:  Telephone appointment with complex care management team member scheduled for:  4/11  Encounter Outcome:  Patient Scheduled  Gwenevere Ghazi  [facility]  [facility], [facility] Guide  Direct Dial: [phone]  Fax [phone]

## 2023-11-07 ENCOUNTER — Telehealth: Payer: Self-pay | Admitting: Neurology

## 2023-11-07 NOTE — Telephone Encounter (Signed)
 Sent mychart

## 2023-11-11 ENCOUNTER — Ambulatory Visit: Payer: Self-pay | Admitting: Licensed Clinical Social Worker
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
## 2023-11-11 NOTE — Patient Instructions (Signed)
 Visit Information  Thank you for taking time to visit with me today. Please don't hesitate to contact me if I can be of assistance to you before our next scheduled appointment.  Our next appointment is by telephone on 12/09/2023 at 3PM Please call the care guide team at [phone] if you need to cancel or reschedule your appointment.   Following is a copy of your care plan:   Goals Addressed             This Visit's Progress    VBCI Social Work Care Plan       Problems:   Disease Management support and education needs related to Insomnia/Sleep Difficulties and PTSD  CSW Clinical Goal(s):   Over the next 4 weeks the Patient will attend all scheduled medical appointments as evidenced by patient report and care team review of appointment completion in EMR: Psychiatry and counseling appts this month demonstrate a reduction in symptoms related to Insomnia/Sleep Difficulties PTSD  verbalize basic understanding of PTSD disease process and self management plan Grounding exercises and coping skills work with Child psychotherapist to address concerns related to triggers for symptoms and practicing coping skills .  Interventions:  Mental Health:  Evaluation of current treatment plan related to Insomnia/Sleep Difficulties and PTSD Active listening / Reflection utilized Behavioral Activation reviewed Crisis Resource Education / information provided Discussed referral for psychiatry: Psych appt scheduled for 4/28 Emotional Support Provided Mindfulness or Relaxation training provided Provided general psycho-education for mental health needs Quality of sleep assessed & Sleep Hygiene techniques promoted Suicidal Ideation/Homicidal Ideation assessed: Denied current SI, reviewed local mobile crisis line Reviewed grounding exercises and mindfulness exercises  Patient Goals/Self-Care Activities:  Connect with provider for ongoing mental health treatment.   Continue taking your medication as  prescribed.   Review Agricultural engineer on Behavioral Activation.  Plan:   Telephone follow up appointment with care management team member scheduled for:  12/09/2023        Please call the Suicide and Crisis Lifeline: 988 if you are experiencing a Mental Health or Behavioral Health Crisis or need someone to talk to.  Patient verbalizes understanding of instructions and care plan provided today and agrees to view in MyChart. Active MyChart status and patient understanding of how to access instructions and care plan via MyChart confirmed with patient.     Kenton Kingfisher, LCSW [place], [facility] Licensed Clinical Social Worker Care Coordinator [phone]

## 2023-11-15 ENCOUNTER — Encounter: Payer: Self-pay | Admitting: Internal Medicine

## 2023-11-16 ENCOUNTER — Encounter: Payer: Self-pay | Admitting: Internal Medicine

## 2023-11-16 ENCOUNTER — Ambulatory Visit: Payer: PRIVATE HEALTH INSURANCE | Admitting: Internal Medicine

## 2023-11-16 VITALS — BP 122/78 | HR 78 | Temp 98.8°F | Resp 16 | Ht 71.0 in | Wt 280.0 lb

## 2023-11-16 DIAGNOSIS — Z1211 Encounter for screening for malignant neoplasm of colon: Secondary | ICD-10-CM | POA: Diagnosis present

## 2023-11-16 MED ORDER — SODIUM CHLORIDE 0.9 % IV SOLN
500.0000 mL | INTRAVENOUS | Status: DC
Start: 2023-11-16 — End: 2023-11-16
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
## 2023-11-16 NOTE — Patient Instructions (Signed)

## 2023-11-16 NOTE — Progress Notes (Signed)
 Pt's states no medical or surgical changes since previsit or office visit.

## 2023-11-16 NOTE — Progress Notes (Signed)
 Report to PACU, RN, vss, BBS= Clear.

## 2023-11-16 NOTE — Op Note (Signed)
 [facility] Patient Name: [name] Procedure Date: 11/16/2023 3:33 PM MRN: [id number] Endoscopist: Wilhemina Bonito. Marina Goodell , MD, [id number] Age: 48 Referring MD:  Date of Birth: Sep 05, 1975 Gender: Male Account #: [id number] Procedure:                Colonoscopy Indications:              Screening for colorectal malignant neoplasm Medicines:                Monitored Anesthesia Care Procedure:                Pre-Anesthesia Assessment:                           - Prior to the procedure, a History and Physical                            was performed, and patient medications and                            allergies were reviewed. The patient's tolerance of                            previous anesthesia was also reviewed. The risks                            and benefits of the procedure and the sedation                            options and risks were discussed with the patient.                            All questions were answered, and informed consent                            was obtained. Prior Anticoagulants: The patient has                            taken no anticoagulant or antiplatelet agents. ASA                            Grade Assessment: II - A patient with mild systemic                            disease. After reviewing the risks and benefits,                            the patient was deemed in satisfactory condition to                            undergo the procedure.                           After obtaining informed consent, the colonoscope  was passed under direct vision. Throughout the                            procedure, the patient's blood pressure, pulse, and                            oxygen saturations were monitored continuously. The                            Olympus CF-HQ190L ([id number]) Colonoscope was                            introduced through the anus and advanced to the the                            cecum, identified  by appendiceal orifice and                            ileocecal valve. The ileocecal valve, appendiceal                            orifice, and rectum were photographed. The quality                            of the bowel preparation was good. The colonoscopy                            was performed without difficulty. The patient                            tolerated the procedure well. The bowel preparation                            used was SUPREP via split dose instruction. Scope In: 3:42:27 PM Scope Out: 3:56:54 PM Scope Withdrawal Time: 0 hours 8 minutes 56 seconds  Total Procedure Duration: 0 hours 14 minutes 27 seconds  Findings:                 The entire examined colon appeared normal on direct                            and retroflexion views. Complications:            No immediate complications. Estimated blood loss:                            None. Estimated Blood Loss:     Estimated blood loss: none. Impression:               - The entire examined colon is normal on direct and                            retroflexion views.                           -  No specimens collected. Recommendation:           - Repeat colonoscopy in 10 years for screening                            purposes.                           - Patient has a contact number available for                            emergencies. The signs and symptoms of potential                            delayed complications were discussed with the                            patient. Return to normal activities tomorrow.                            Written discharge instructions were provided to the                            patient.                           - Resume previous diet.                           - Continue present medications. Murel Arlington. Elvin Hammer, MD 11/16/2023 4:00:31 PM This report has been signed electronically.

## 2023-11-17 ENCOUNTER — Telehealth: Payer: Self-pay

## 2023-11-17 NOTE — Telephone Encounter (Signed)
 Post procedure follow up call, no answer

## 2023-11-29 NOTE — Telephone Encounter (Signed)
 [employer] no auth req ref # [id number] on 11/23/23   Patient is scheduled at [facility] for 12/26/23 at 9 pm.  Mailed packet and sent mychart.

## 2023-12-09 ENCOUNTER — Encounter: Payer: Self-pay | Admitting: Licensed Clinical Social Worker

## 2023-12-09 ENCOUNTER — Other Ambulatory Visit: Payer: PRIVATE HEALTH INSURANCE | Admitting: Licensed Clinical Social Worker

## 2023-12-26 ENCOUNTER — Ambulatory Visit (INDEPENDENT_AMBULATORY_CARE_PROVIDER_SITE_OTHER): Admitting: Neurology

## 2023-12-26 DIAGNOSIS — G4733 Obstructive sleep apnea (adult) (pediatric): Secondary | ICD-10-CM

## 2023-12-26 DIAGNOSIS — I350 Nonrheumatic aortic (valve) stenosis: Secondary | ICD-10-CM

## 2023-12-26 DIAGNOSIS — F1190 Opioid use, unspecified, uncomplicated: Secondary | ICD-10-CM

## 2023-12-26 DIAGNOSIS — Z9189 Other specified personal risk factors, not elsewhere classified: Secondary | ICD-10-CM

## 2023-12-26 DIAGNOSIS — G4720 Circadian rhythm sleep disorder, unspecified type: Secondary | ICD-10-CM

## 2023-12-26 DIAGNOSIS — R351 Nocturia: Secondary | ICD-10-CM

## 2023-12-26 DIAGNOSIS — E669 Obesity, unspecified: Secondary | ICD-10-CM

## 2023-12-26 DIAGNOSIS — R0683 Snoring: Secondary | ICD-10-CM | POA: Diagnosis not present

## 2023-12-26 DIAGNOSIS — G4752 REM sleep behavior disorder: Secondary | ICD-10-CM

## 2023-12-26 DIAGNOSIS — F1290 Cannabis use, unspecified, uncomplicated: Secondary | ICD-10-CM

## 2023-12-26 DIAGNOSIS — R0681 Apnea, not elsewhere classified: Secondary | ICD-10-CM

## 2023-12-26 DIAGNOSIS — R6889 Other general symptoms and signs: Secondary | ICD-10-CM

## 2023-12-31 ENCOUNTER — Encounter (INDEPENDENT_AMBULATORY_CARE_PROVIDER_SITE_OTHER): Payer: Self-pay

## 2024-01-01 ENCOUNTER — Encounter: Payer: Self-pay | Admitting: Nurse Practitioner

## 2024-01-01 ENCOUNTER — Telehealth: Payer: PRIVATE HEALTH INSURANCE

## 2024-01-02 ENCOUNTER — Telehealth: Payer: PRIVATE HEALTH INSURANCE

## 2024-01-02 DIAGNOSIS — I10 Essential (primary) hypertension: Secondary | ICD-10-CM | POA: Diagnosis not present

## 2024-01-02 MED ORDER — AMLODIPINE BESYLATE 10 MG PO TABS: 10.0000 mg | ORAL_TABLET | Freq: Every day | ORAL | 0 refills | Status: DC

## 2024-01-02 NOTE — Patient Instructions (Signed)
 [name], thank you for joining [name], PA-C for today's virtual visit.  While this provider is not your primary care provider (PCP), if your PCP is located in our provider database this encounter information will be shared with them immediately following your visit.   A [facility] MyChart account gives you access to today's visit and all your visits, tests, and labs performed at [facility] " click here if you don't have a [facility] MyChart account or go to [email or web address]  Consent: (Patient) [name] provided verbal consent for this virtual visit at the beginning of the encounter.  Current Medications:  Current Outpatient Medications:    acetaminophen  (TYLENOL ) 325 MG tablet, Take 2 tablets (650 mg total) by mouth every 6 (six) hours as needed for mild pain (or Fever >/= 101). (Patient not taking: Reported on 11/11/2023), Disp: 30 tablet, Rfl: 0   amLODipine  (NORVASC ) 10 MG tablet, Take 1 tablet (10 mg total) by mouth daily., Disp: 90 tablet, Rfl: 0   atorvastatin  (LIPITOR ) 40 MG tablet, Take 1 tablet (40 mg total) by mouth daily., Disp: 90 tablet, Rfl: 3   Buprenorphine  HCl-Naloxone  HCl 12-3 MG FILM, Place 1 Film under the tongue 2 (two) times daily., Disp: , Rfl:    cloNIDine  (CATAPRES ) 0.1 MG tablet, Take 1 tablet (0.1 mg total) by mouth 2 (two) times daily., Disp: 60 tablet, Rfl: 11   FLUoxetine  (PROZAC ) 40 MG capsule, Take 1 capsule (40 mg total) by mouth daily. (Patient not taking: Reported on 11/11/2023), Disp: 90 capsule, Rfl: 3   losartan  (COZAAR ) 50 MG tablet, Take 1 tablet (50 mg total) by mouth daily at 10 pm., Disp: 90 tablet, Rfl: 4   metoprolol  succinate (TOPROL -XL) 25 MG 24 hr tablet, Take 1 tablet (25 mg total) by mouth daily., Disp: 90 tablet, Rfl: 3   nitroGLYCERIN  (NITROSTAT ) 0.4 MG SL tablet, Place 1 tablet (0.4 mg total) under the tongue every 5 (five) minutes as needed for chest pain., Disp: 30 tablet, Rfl: 0   prazosin   (MINIPRESS ) 1 MG capsule, Take 1 capsule (1 mg total) by mouth at bedtime. 1 mg at bedtime; after 2 to 3 days increase dose to 2 mg at bedtime, Disp: 60 capsule, Rfl: 2   senna (SENOKOT) 8.6 MG TABS tablet, Take 1 tablet (8.6 mg total) by mouth daily., Disp: 120 tablet, Rfl: 2   Medications ordered in this encounter:  Meds ordered this encounter  Medications   amLODipine  (NORVASC ) 10 MG tablet    Sig: Take 1 tablet (10 mg total) by mouth daily.    Dispense:  90 tablet    Refill:  0    Please fill new prescription for patient, lost old prescription    Supervising Provider:   Corine Dice [[id number]]     *If you need refills on other medications prior to your next appointment, please contact your pharmacy*  Follow-Up: Call back or seek an in-person evaluation if the symptoms worsen or if the condition fails to improve as anticipated.  [facility] [phone]  Other Instructions  Managing Your Hypertension Hypertension, also called high blood pressure, is when the force of the blood pressing against the walls of the arteries is too strong. Arteries are blood vessels that carry blood from your heart throughout your body. Hypertension forces the heart to work harder to pump blood and may cause the arteries to become narrow or stiff. Understanding blood pressure readings A blood pressure reading includes a  higher number over a lower number: The first, or top, number is called the systolic pressure. It is a measure of the pressure in your arteries as your heart beats. The second, or bottom number, is called the diastolic pressure. It is a measure of the pressure in your arteries as the heart relaxes. For most people, a normal blood pressure is below 120/80. Your personal target blood pressure may vary depending on your medical conditions, your age, and other factors. Blood pressure is classified into four stages. Based on your blood pressure reading, your health care  provider may use the following stages to determine what type of treatment you need, if any. Systolic pressure and diastolic pressure are measured in a unit called millimeters of mercury (mmHg). Normal Systolic pressure: below 120. Diastolic pressure: below 80. Elevated Systolic pressure: 120-129. Diastolic pressure: below 80. Hypertension stage 1 Systolic pressure: 130-139. Diastolic pressure: 80-89. Hypertension stage 2 Systolic pressure: 140 or above. Diastolic pressure: 90 or above. How can this condition affect me? Managing your hypertension is very important. Over time, hypertension can damage the arteries and decrease blood flow to parts of the body, including the brain, heart, and kidneys. Having untreated or uncontrolled hypertension can lead to: A heart attack. A stroke. A weakened blood vessel (aneurysm). Heart failure. Kidney damage. Eye damage. Memory and concentration problems. Vascular dementia. What actions can I take to manage this condition? Hypertension can be managed by making lifestyle changes and possibly by taking medicines. Your health care provider will help you make a plan to bring your blood pressure within a normal range. You may be referred for counseling on a healthy diet and physical activity. Nutrition  Eat a diet that is high in fiber and potassium, and low in salt (sodium), added sugar, and fat. An example eating plan is called the DASH diet. DASH stands for Dietary Approaches to Stop Hypertension. To eat this way: Eat plenty of fresh fruits and vegetables. Try to fill one-half of your plate at each meal with fruits and vegetables. Eat whole grains, such as whole-wheat pasta, brown rice, or whole-grain bread. Fill about one-fourth of your plate with whole grains. Eat low-fat dairy products. Avoid fatty cuts of meat, processed or cured meats, and poultry with skin. Fill about one-fourth of your plate with lean proteins such as fish, chicken without skin,  beans, eggs, and tofu. Avoid pre-made and processed foods. These tend to be higher in sodium, added sugar, and fat. Reduce your daily sodium intake. Many people with hypertension should eat less than 1,500 mg of sodium a day. Lifestyle  Work with your health care provider to maintain a healthy body weight or to lose weight. Ask what an ideal weight is for you. Get at least 30 minutes of exercise that causes your heart to beat faster (aerobic exercise) most days of the week. Activities may include walking, swimming, or biking. Include exercise to strengthen your muscles (resistance exercise), such as weight lifting, as part of your weekly exercise routine. Try to do these types of exercises for 30 minutes at least 3 days a week. Do not use any products that contain nicotine  or tobacco. These products include cigarettes, chewing tobacco, and vaping devices, such as e-cigarettes. If you need help quitting, ask your health care provider. Control any long-term (chronic) conditions you have, such as high cholesterol or diabetes. Identify your sources of stress and find ways to manage stress. This may include meditation, deep breathing, or making time for fun activities. Alcohol  use Do not drink alcohol if: Your health care provider tells you not to drink. You are pregnant, may be pregnant, or are planning to become pregnant. If you drink alcohol: Limit how much you have to: 0-1 drink a day for women. 0-2 drinks a day for men. Know how much alcohol is in your drink. In the [place] one drink equals one 12 oz bottle of beer (355 mL), one 5 oz glass of wine (148 mL), or one 1 oz glass of hard liquor (44 mL). Medicines Your health care provider may prescribe medicine if lifestyle changes are not enough to get your blood pressure under control and if: Your systolic blood pressure is 130 or higher. Your diastolic blood pressure is 80 or higher. Take medicines only as told by your health care provider.  Follow the directions carefully. Blood pressure medicines must be taken as told by your health care provider. The medicine does not work as well when you skip doses. Skipping doses also puts you at risk for problems. Monitoring Before you monitor your blood pressure: Do not smoke, drink caffeinated beverages, or exercise within 30 minutes before taking a measurement. Use the bathroom and empty your bladder (urinate). Sit quietly for at least 5 minutes before taking measurements. Monitor your blood pressure at home as told by your health care provider. To do this: Sit with your back straight and supported. Place your feet flat on the floor. Do not cross your legs. Support your arm on a flat surface, such as a table. Make sure your upper arm is at heart level. Each time you measure, take two or three readings one minute apart and record the results. You may also need to have your blood pressure checked regularly by your health care provider. General information Talk with your health care provider about your diet, exercise habits, and other lifestyle factors that may be contributing to hypertension. Review all the medicines you take with your health care provider because there may be side effects or interactions. Keep all follow-up visits. Your health care provider can help you create and adjust your plan for managing your high blood pressure. Where to find more information National Heart, Lung, and Blood Institute: PopSteam.is American Heart Association: www.heart.org Contact a health care provider if: You think you are having a reaction to medicines you have taken. You have repeated (recurrent) headaches. You feel dizzy. You have swelling in your ankles. You have trouble with your vision. Get help right away if: You develop a severe headache or confusion. You have unusual weakness or numbness, or you feel faint. You have severe pain in your chest or abdomen. You vomit  repeatedly. You have trouble breathing. These symptoms may be an emergency. Get help right away. Call 911. Do not wait to see if the symptoms will go away. Do not drive yourself to the hospital. Summary Hypertension is when the force of blood pumping through your arteries is too strong. If this condition is not controlled, it may put you at risk for serious complications. Your personal target blood pressure may vary depending on your medical conditions, your age, and other factors. For most people, a normal blood pressure is less than 120/80. Hypertension is managed by lifestyle changes, medicines, or both. Lifestyle changes to help manage hypertension include losing weight, eating a healthy, low-sodium diet, exercising more, stopping smoking, and limiting alcohol. This information is not intended to replace advice given to you by your health care provider. Make sure you discuss any questions  you have with your health care provider. Document Revised: 04/02/2021 Document Reviewed: 04/02/2021 Elsevier Patient Education  2024 Elsevier Inc.   If you have been instructed to have an in-person evaluation today at a local Urgent Care facility, please use the link below. It will take you to a list of all of our available [facility], including address, phone number and hours of operation. Please do not delay care.  [facility]  If you or a family member do not have a primary care provider, use the link below to schedule a visit and establish care. When you choose a [facility] primary care physician or advanced practice provider, you gain a long-term partner in health. Find a Primary Care Provider  Learn more about [facility]'s in-office and virtual care options:  - Get Care Now

## 2024-01-02 NOTE — Progress Notes (Signed)
 Virtual Visit Consent   [name], you are scheduled for a virtual visit with a [facility] provider today. Just as with appointments in the office, your consent must be obtained to participate. Your consent will be active for this visit and any virtual visit you may have with one of our providers in the next 365 days. If you have a MyChart account, a copy of this consent can be sent to you electronically.  As this is a virtual visit, video technology does not allow for your provider to perform a traditional examination. This may limit your provider's ability to fully assess your condition. If your provider identifies any concerns that need to be evaluated in person or the need to arrange testing (such as labs, EKG, etc.), we will make arrangements to do so. Although advances in technology are sophisticated, we cannot ensure that it will always work on either your end or our end. If the connection with a video visit is poor, the visit may have to be switched to a telephone visit. With either a video or telephone visit, we are not always able to ensure that we have a secure connection.  By engaging in this virtual visit, you consent to the provision of healthcare and authorize for your insurance to be billed (if applicable) for the services provided during this visit. Depending on your insurance coverage, you may receive a charge related to this service.  I need to obtain your verbal consent now. Are you willing to proceed with your visit today? [name] has provided verbal consent on 01/02/2024 for a virtual visit (video or telephone). [name], PA-C  Date: 01/02/2024 2:55 PM   Virtual Visit via Video Note   I, [name], connected with  [name]  ([id number], 06-13-1976) on 01/02/24 at  2:45 PM EDT by a video-enabled telemedicine application and verified that I am speaking with the correct person using two identifiers.  Location: Patient: Virtual Visit  Location Patient: Mobile Provider: Virtual Visit Location Provider: Home Office   I discussed the limitations of evaluation and management by telemedicine and the availability of in person appointments. The patient expressed understanding and agreed to proceed.    History of Present Illness: [name] is a 48 y.o. who identifies as a male who was assigned male at birth, and is being seen today for medication refill. Patient lost his medications and was able to have everything refilled except his Amlodipine  10mg , as this was too expensive ($144 without insurance coverage). He is requesting a new prescription to see if it will help get insurance to cover.    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   Aneurysm of ascending aorta without rupture (HCC) 02/16/2023   Abnormal CT scan 02/16/2023   Benign hypertension 02/16/2023   Former smoker 02/16/2023   Cannabis use disorder 01/10/2023   Alcohol use disorder in remission 01/10/2023   Severe benzodiazepine use disorder (HCC) 01/10/2023   Severe opioid use disorder, in sustained remission, on maintenance therapy (HCC) 01/10/2023   Substance induced mood disorder (HCC) 01/09/2023  Coronary artery spasm (HCC) 09/24/2020   Bicuspid aortic valve with ascending aorta 4.0 to 4.5 cm in diameter 09/24/2020   Tobacco use 09/24/2020   Nonspecific abnormal electrocardiogram (ECG) (EKG)    Nonrheumatic aortic valve insufficiency     Allergies:  Allergies  Allergen Reactions   Gabapentin  Other (See Comments)    Jerky  movements of extremities, unclear if true allergy   Medications:  Current Outpatient Medications:    acetaminophen  (TYLENOL ) 325 MG tablet, Take 2 tablets (650 mg total) by mouth every 6 (six) hours as needed for mild pain (or Fever >/= 101). (Patient not taking: Reported on 11/11/2023), Disp: 30 tablet, Rfl: 0   amLODipine  (NORVASC ) 10 MG tablet, Take 1 tablet (10 mg total) by mouth daily., Disp: 90 tablet, Rfl: 0   atorvastatin  (LIPITOR ) 40 MG tablet, Take 1 tablet (40 mg total) by mouth daily., Disp: 90 tablet, Rfl: 3   Buprenorphine  HCl-Naloxone  HCl 12-3 MG FILM, Place 1 Film under the tongue 2 (two) times daily., Disp: , Rfl:    cloNIDine  (CATAPRES ) 0.1 MG tablet, Take 1 tablet (0.1 mg total) by mouth 2 (two) times daily., Disp: 60 tablet, Rfl: 11   FLUoxetine  (PROZAC ) 40 MG capsule, Take 1 capsule (40 mg total) by mouth daily. (Patient not taking: Reported on 11/11/2023), Disp: 90 capsule, Rfl: 3   losartan  (COZAAR ) 50 MG tablet, Take 1 tablet (50 mg total) by mouth daily at 10 pm., Disp: 90 tablet, Rfl: 4   metoprolol  succinate (TOPROL -XL) 25 MG 24 hr tablet, Take 1 tablet (25 mg total) by mouth daily., Disp: 90 tablet, Rfl: 3   nitroGLYCERIN  (NITROSTAT ) 0.4 MG SL tablet, Place 1 tablet (0.4 mg total) under the tongue every 5 (five) minutes as needed for chest pain., Disp: 30 tablet, Rfl: 0   prazosin  (MINIPRESS ) 1 MG capsule, Take 1 capsule (1 mg total) by mouth at bedtime. 1 mg at bedtime; after 2 to 3 days increase dose to 2 mg at bedtime, Disp: 60 capsule, Rfl: 2   senna (SENOKOT) 8.6 MG TABS tablet, Take 1 tablet (8.6 mg total) by mouth daily., Disp: 120 tablet, Rfl: 2  Observations/Objective: Patient is well-developed, well-nourished in no acute distress.  Resting comfortably at home.  Head is normocephalic, atraumatic.  No labored breathing.  Speech is clear and coherent with logical content.  Patient is alert and oriented at baseline.    Assessment and Plan: 1. Benign  hypertension - amLODipine  (NORVASC ) 10 MG tablet; Take 1 tablet (10 mg total) by mouth daily.  Dispense: 90 tablet; Refill: 0  - Amlodipine  10mg  refilled x 90 days to pharmacy - Did discuss if insurance still does not cover, that potentially we could dose it Amlodipine  5mg  Take 2 tablets (10mg ) daily to see if a dose change would help with coverage - Follow up as needed  Follow Up Instructions: I discussed the assessment and treatment plan with the patient. The patient was provided an opportunity to ask questions and all were answered. The patient agreed with the plan and demonstrated an understanding of the instructions.  A copy of instructions were sent to the patient via MyChart unless otherwise noted below.    The patient was advised to call back or seek an in-person evaluation if the symptoms worsen or if the condition fails to improve as anticipated.    [name], PA-C

## 2024-01-04 ENCOUNTER — Encounter: Payer: Self-pay | Admitting: Student

## 2024-01-04 ENCOUNTER — Ambulatory Visit (INDEPENDENT_AMBULATORY_CARE_PROVIDER_SITE_OTHER): Payer: PRIVATE HEALTH INSURANCE | Admitting: Student

## 2024-01-04 VITALS — BP 101/63 | HR 65 | Temp 97.7°F | Ht 70.0 in | Wt 285.2 lb

## 2024-01-04 DIAGNOSIS — R5383 Other fatigue: Secondary | ICD-10-CM | POA: Insufficient documentation

## 2024-01-04 DIAGNOSIS — N529 Male erectile dysfunction, unspecified: Secondary | ICD-10-CM | POA: Diagnosis not present

## 2024-01-04 DIAGNOSIS — F3341 Major depressive disorder, recurrent, in partial remission: Secondary | ICD-10-CM

## 2024-01-04 DIAGNOSIS — I10 Essential (primary) hypertension: Secondary | ICD-10-CM | POA: Diagnosis not present

## 2024-01-04 MED ORDER — TRAZODONE HCL 50 MG PO TABS: 50.0000 mg | ORAL_TABLET | Freq: Every day | ORAL | 1 refills | Status: DC

## 2024-01-04 MED ORDER — SERTRALINE HCL 50 MG PO TABS: 50.0000 mg | ORAL_TABLET | Freq: Every day | ORAL | 3 refills | Status: DC

## 2024-01-04 NOTE — Assessment & Plan Note (Addendum)
 He is reporting longstanding erectile dysfunction, which has been worse in the last 9 months.  He has almost no libido anymore and he reports that he no longer has erections or masturbates at all.  There are many possible contributors to this including his mood, his lack of quality sleep, his cardiovascular disease, and his antidepressant medication.  I am switching him over to Zoloft from Prozac  to see if this makes a difference in his sex drive.  Considered Wellbutrin , however he reported that it made him more anxious when he took in the past for smoking cessation.  Will started Zoloft 50 mg daily and titrate it as appropriate.  He would like to check his testosterone today, which I think is reasonable.  Will follow-up in 4 weeks to reassess this as well as revisit the other concerns mentioned at this visit.
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
## 2024-01-04 NOTE — Patient Instructions (Signed)
 Thank you, Mr.Sanford S Mckinlay for allowing us  to provide your care today. Today we discussed your sleep, antidepressant medications, and sex drive.  For your sleep, I am glad you are able to get a sleep study.  I have started a medication called trazodone  50 mg daily, which you can take once daily at bedtime to help with your sleep.  The sleep medicine doctors will help you further manage your nightmares.  We have switched you from Prozac  to Zoloft.  Will start at the 50 mg dose once daily.  You can stop taking the Prozac .  We will see if this change helps with your sex drive.    I have ordered the following labs for you:  Lab Orders         Testosterone, Total         CBC no Diff         BMP8+Anion Gap      I will call you as soon as I have the results.   I have ordered the following medication/changed the following medications:   Start the following medications: Meds ordered this encounter  Medications   traZODone  (DESYREL ) 50 MG tablet    Sig: Take 1 tablet (50 mg total) by mouth at bedtime.    Dispense:  30 tablet    Refill:  1   sertraline (ZOLOFT) 50 MG tablet    Sig: Take 1 tablet (50 mg total) by mouth daily.    Dispense:  90 tablet    Refill:  3     Follow up: 4 weeks  We look forward to seeing you next time. Please call our clinic at [phone] if you have any questions or concerns. The best time to call is Monday-Friday from 9am-4pm, but there is someone available 24/7. If after hours or the weekend, call the main hospital number and ask for the Internal Medicine Resident On-Call. If you need medication refills, please notify your pharmacy one week in advance and they will send us  a request.   Thank you for trusting me with your care. Wishing you the best!   Dorthy Gavia, MD [facility]

## 2024-01-04 NOTE — Assessment & Plan Note (Addendum)
 His blood pressure is 101/63 today.  He says he takes his blood pressure at home and it usually is in the 110s over 60s to 80s diastolic.  He denies any symptoms concerning for hypertension or hypotension.  He notes that he has not taken his amlodipine  in a few days.    Since blood pressure is at the lower end of normal without his amlodipine , I will go ahead and discontinue that for now.  We can continue with his other medications as prescribed and recheck this at his next visit.

## 2024-01-04 NOTE — Assessment & Plan Note (Addendum)
 He says he has in general felt more weak recently than usual.  He denies any lightheadedness or dizziness, but just generalized weakness.  He notes that he has not been sleeping very well at all, which is a chronic problem but has been especially bad this past week.  He has recently had a sleep study, with the results still pending.  He has long history of PTSD and nightmares, for which he is taking prazosin .  He says he has been sleeping around 3 hours per night for the past week.  He does not report any history of mania.  He usually falls asleep around 1 AM and then wakes up around 3 or 4 AM, unable to return to sleep.  His poor sleep is likely contributing to his generalized fatigue.  He denies any recent illnesses, fever or chills.  He does not have any acute chest pain.  He feels that his health is overall stable, but he is not sure why he is tired.    I will check a CBC and BMP today.  He had a TSH checked earlier this year, which was normal.  We can consider checking for nutritional deficiency labs at his next visit if his fatigue persists, but I think it is most likely related to his lack of sleep.  I have started him on trazodone  50 mg daily to help him with his sleep.  Recommended he follow-up with his sleep doctor for further management of his nightmares.

## 2024-01-04 NOTE — Assessment & Plan Note (Signed)
 He feels that his mood is stable.  Denies any SI/HI.  Has been on Prozac  40 mg daily and says it is going fine.  We will try him on Zoloft 50 mg daily instead, to see if it improves his sexual function.  Will consider increasing this to 50 mg twice a day if needed at his next appointment.

## 2024-01-05 LAB — BMP8+ANION GAP
Anion Gap: 16 mmol/L (ref 10.0–18.0)
BUN/Creatinine Ratio: 17 (ref 9–20)
BUN: 13 mg/dL (ref 6–24)
CO2: 21 mmol/L (ref 20–29)
Calcium: 9.3 mg/dL (ref 8.7–10.2)
Chloride: 103 mmol/L (ref 96–106)
Creatinine, Ser: 0.78 mg/dL (ref 0.76–1.27)
Glucose: 96 mg/dL (ref 70–99)
Potassium: 4.7 mmol/L (ref 3.5–5.2)
Sodium: 140 mmol/L (ref 134–144)
eGFR: 109 mL/min/{1.73_m2} (ref >59)

## 2024-01-05 LAB — CBC
Hematocrit: 41.5 % (ref 37.5–51.0)
Hemoglobin: 13.8 g/dL (ref 13.0–17.7)
MCH: 28.5 pg (ref 26.6–33.0)
MCHC: 33.3 g/dL (ref 31.5–35.7)
MCV: 86 fL (ref 79–97)
Platelets: 253 10*3/uL (ref 150–450)
RBC: 4.85 x10E6/uL (ref 4.14–5.80)
RDW: 13.6 % (ref 11.6–15.4)
WBC: 7.7 10*3/uL (ref 3.4–10.8)

## 2024-01-05 LAB — TESTOSTERONE: Testosterone: 148 ng/dL — ABNORMAL LOW (ref 264–916)

## 2024-01-09 NOTE — Progress Notes (Signed)
 Internal Medicine Clinic Attending  Case discussed with the resident at the time of the visit.  We reviewed the resident's history and exam and pertinent patient test results.  I agree with the assessment, diagnosis, and plan of care documented in the resident's note.

## 2024-01-13 ENCOUNTER — Ambulatory Visit: Payer: Self-pay

## 2024-01-13 NOTE — Telephone Encounter (Signed)
 FYI Only or Action Required?: FYI only for provider  Patient was last seen in primary care on 01/04/2024 by Dorthy Gavia, MD. Called Nurse Triage reporting Fatigue. Symptoms began about a month ago. Interventions attempted: Rest, hydration, or home remedies. Symptoms are: gradually worsening.  Triage Disposition: See PCP When Office is Open (Within 3 Days)  Patient/caregiver understands and will follow disposition?: Yes  Copied from CRM [id number]. Topic: Clinical - Red Word Triage >> Jan 13, 2024  2:45 PM Tiffany H wrote: Red Word: Patient called to advise that he's been suffering from extreme fatigue and shortness of breath for over 2 weeks. Patient advised that he gets winded when he puts on his shoulders. Patient has history of aortic valve problems. Please assist. Reason for Disposition  [1] MODERATE longstanding difficulty breathing (e.g., speaks in phrases, SOB even at rest, pulse 100-120) AND [2] SAME as normal  Answer Assessment - Initial Assessment Questions 1. RESPIRATORY STATUS: Describe your breathing? (e.g., wheezing, shortness of breath, unable to speak, severe coughing)      Shortness of breath, like he is in a high altitude. He states that he can take a deep breath, but still does not feel like he gets the air that he needs 2. ONSET: When did this breathing problem begin?      About 6 weeks ago, did not mention at his last office visit 3. PATTERN Does the difficult breathing come and go, or has it been constant since it started?      Worsened since his last office visit.  Shortness of breath all day 4. SEVERITY: How bad is your breathing? (e.g., mild, moderate, severe)    - MILD: No SOB at rest, mild SOB with walking, speaks normally in sentences, can lie down, no retractions, pulse < 100.    - MODERATE: SOB at rest, SOB with minimal exertion and prefers to sit, cannot lie down flat, speaks in phrases, mild retractions, audible wheezing, pulse 100-120.    - SEVERE: Very  SOB at rest, speaks in single words, struggling to breathe, sitting hunched forward, retractions, pulse > 120      Exacerbated by work, but lays on side to manage shortness of breath 5. RECURRENT SYMPTOM: Have you had difficulty breathing before? If Yes, ask: When was the last time? and What happened that time?      denies 6. CARDIAC HISTORY: Do you have any history of heart disease? (e.g., heart attack, angina, bypass surgery, angioplasty)      Reports heart valve issues, takes clonidine , losartan , metoprolol , atorvastatin ,  7. LUNG HISTORY: Do you have any history of lung disease?  (e.g., pulmonary embolus, asthma, emphysema)     denies 8. CAUSE: What do you think is causing the breathing problem?      Does not know 9. OTHER SYMPTOMS: Do you have any other symptoms? (e.g., dizziness, runny nose, cough, chest pain, fever)     Reports having a stuffy nose due to medication 10. O2 SATURATION MONITOR:  Do you use an oxygen saturation monitor (pulse oximeter) at home? If Yes, ask: What is your reading (oxygen level) today? What is your usual oxygen saturation reading? (e.g., 95%)      Does not have BP 160/90, pulse 60  Protocols used: Breathing Difficulty-A-AH

## 2024-01-15 ENCOUNTER — Ambulatory Visit: Payer: Self-pay | Admitting: Neurology

## 2024-01-15 DIAGNOSIS — G4733 Obstructive sleep apnea (adult) (pediatric): Secondary | ICD-10-CM

## 2024-01-15 NOTE — Procedures (Signed)
 Physician Interpretation:     [facility] POLYSOMNOGRAPHY  INTERPRETATION REPORT   STUDY DATE:  12/26/2023     PATIENT NAME:  [name]         DATE OF BIRTH:  01-May-1976  PATIENT ID:  [id number]    TYPE OF STUDY:  PSG  READING PHYSICIAN: Debbra Fairy, MD, PhD   SCORING TECHNICIAN: Auston Left, RPSGT   Referred by: Dr. Knox Perl ? History and Indication for Testing: 48 year old male with an underlying complex medical history of aortic stenosis, aortic aneurysm, hypertension, hyperlipidemia, mood disorder, chronic back pain, opioid use disorder (on Suboxone ), reflux disease, anxiety, depression, prior smoking, SUD, and obesity, who reports snoring and excessive daytime somnolence, as well as witnessed apneas. His Epworth sleepiness score is 0 out of 24, fatigue severity score is 42 out of 63.  Height: 70 in Weight: 265 lb (BMI 38) Neck Size: 17 in    MEDICATIONS: Tylenol , Norvasc , Lipitor , Buprenorphine , Buspar , Catapres , Prozac , Cozaar , Toprol  - XL, Nitrostat , Zofran  ODT, Neurontin    TECHNICAL DESCRIPTION: A registered sleep technologist was in attendance for the duration of the recording.  Data collection, scoring, video monitoring, and reporting were performed in compliance with the AASM Manual for the Scoring of Sleep and Associated Events; (Hypopnea is scored based on the criteria listed in Section VIII D. 1b in the AASM Manual V2.6 using a 4% oxygen desaturation rule or Hypopnea is scored based on the criteria listed in Section VIII D. 1a in the AASM Manual V2.6 using 3% oxygen desaturation and /or arousal rule).   SLEEP CONTINUITY AND SLEEP ARCHITECTURE:  The patient took Tylenol  PM prior to study start. Lights-out was at 21:59: and lights-on at  05:19:, with a total recording time of 7 hours, 19.5 min. Total sleep time ( TST) was 361.0 minutes with a sleep efficiency of 82.1%. There was  11.9% REM sleep.  BODY POSITION:  TST was divided  between  the following sleep positions: 38.8% supine;  61.2% lateral;  0% prone. Duration of total sleep and percent of total sleep in their respective position is as follows: supine 140 minutes (39%), non-supine 221 minutes (61%); right 25 minutes (7%), left 196 minutes (54%), and prone 00 minutes (0%).  Total supine REM sleep time was 00 minutes (0% of total REM sleep).  Sleep latency was normal at 23.5 minutes.  REM sleep latency was markedly increased at 365.0 minutes. Of the total sleep time, the percentage of stage N1 sleep was 3.5%, stage N2 sleep was 61%, which is increased, stage N3 sleep was 23.8%, which is increased, and REM sleep was 11.9%, which is reduced. Wake after sleep onset (WASO) time accounted for 55 minutes with minimal to mild sleep fragmentation noted.   RESPIRATORY MONITORING:   Based on CMS criteria (using a 4% oxygen desaturation rule for scoring hypopneas), there were 20 apneas (17 obstructive; 3 central; 0 mixed), and 77 hypopneas.  Apnea index was 3.3. Hypopnea index was 12.8. The apnea-hypopnea index was 16.1/hour overall (14.1 supine, 32 non-supine; 32.1 REM, 0.0 supine REM).  There were 0 respiratory effort-related arousals (RERAs).  The RERA index was 0 events/h. Total respiratory disturbance index (RDI) was 16.1 events/h. RDI results showed: supine RDI  14.1 /h; non-supine RDI 17.4 /h; REM RDI 32.1 /h, supine REM RDI 0.0 /h.   Based on AASM criteria (using a 3% oxygen desaturation and /or arousal rule for scoring hypopneas), there were 20 apneas (17 obstructive; 3 central; 0 mixed),  and 140 hypopneas. Apnea index was 3.3. Hypopnea index was 23.3. The apnea-hypopnea index was 26.6 overall (24.0 supine, 46 non-supine; 46.0 REM, 0.0 supine REM).  There were 0 respiratory effort-related arousals (RERAs).  The RERA index was 0 events/h. Total respiratory disturbance index (RDI) was 26.6 events/h. RDI results showed: supine RDI  24.0 /h; non-supine RDI 28.2 /h; REM RDI 46.0 /h, supine  REM RDI 0.0 /h.  OXIMETRY: Oxyhemoglobin Saturation Nadir during sleep was at  77% from a mean of 91%.  Of the Total sleep time (TST)   hypoxemia (=<88%) was present for  74.9 minutes, or 20.7% of total sleep time.  LIMB MOVEMENTS: There were 0 periodic limb movements of sleep (0.0/hr), of which 0 (0.0/hr) were associated with an arousal.   AROUSAL: There were 77 arousals in total, for an arousal index of 13 arousals/hour.  Of these, 40 were identified as respiratory-related arousals (7 /h), 0 were PLM-related arousals (0 /h), and 50 were non-specific arousals (8 /h).    EEG: Review of the EEG showed no abnormal electrical discharges and symmetrical bihemispheric findings.      EKG: The EKG revealed normal sinus rhythm (NSR). The average heart rate during sleep was 65 bpm.     AUDIO/VIDEO REVIEW: The audio and video review did not show any abnormal or unusual behaviors, movements, phonations or vocalizations, except some mottling noted in REM sleep and occasional limb movements during REM sleep. The patient took 2 restroom breaks. Snoring was noted, ranging from mild to louder.  POST-STUDY QUESTIONNAIRE: Post study, the patient indicated, that sleep was better than usual.      IMPRESSION:    1. Moderate obstructive Sleep Apnea (OSA) 2. Dysfunctions associated with sleep stages or arousal from sleep   RECOMMENDATIONS:    1. This study demonstrates moderate to severe obstructive sleep apnea, with a total AHI of 16.1/hour, REM AHI of 32.1/hour, supine AHI of 14.1/hour and O2 nadir of 77% (during supine non-REM sleep). Treatment with a positive airway pressure (PAP) device is recommended.  The patient will be advised to proceed with home autoPAP therapy for now.  A laboratory attended, full night PAP-titration study can be considered down the road, to optimize therapy settings, mask fit, monitoring of tolerance and of proper oxygen saturations. Other treatment options may be limited, and may  include (generally speaking) surgical options in selected patients.  Concomitant weight loss is recommended. Please note that untreated obstructive sleep apnea may carry additional perioperative morbidity. Patients with significant obstructive sleep apnea should receive perioperative PAP therapy and the surgeons and particularly the anesthesiologist should be informed of the diagnosis and the severity of the sleep disordered breathing. 2. This study shows some sleep fragmentation and abnormal sleep stage percentages; these are nonspecific findings and per se do not signify an intrinsic sleep disorder or a cause for the patient's sleep-related symptoms. Causes include (but are not limited to) the first night effect of the sleep study, circadian rhythm disturbances, medication effect or an underlying mood disorder or medical problem.  3. The patient should be cautioned not to drive, work at heights, or operate dangerous or heavy equipment when tired or sleepy. Review and reiteration of good sleep hygiene measures should be pursued with any patient. 4. The patient will be seen in follow-up in the sleep clinic at [facility] for discussion of the test results, symptom and treatment compliance review, further management strategies, etc. The patient and the referring provider will be notified of the test results.  I certify that I have reviewed the entire raw data recording prior to the issuance of this report in accordance with the Standards of Accreditation of the American Academy of Sleep Medicine (AASM).  Debbra Fairy, MD, PhD Medical Director, [facility]) Diplomat, ABPN (Neurology and Sleep)               Technical Report:   General Information  Name: Kobyn, Kray BMI: 16.10 Physician: Debbra Fairy, MD  ID: [id number] Height: 70.0 in Technician: Auston Left, RPSGT  Sex: Male Weight: 265.0 lb Record: [id number]  Age: 56 [30-Mar-1976] Date: 12/26/2023     Medical & Medication History    48 year old male with an underlying complex medical history of aortic stenosis, aortic aneurysm, hypertension, hyperlipidemia, mood disorder, chronic back pain, opioid use disorder, currently on Suboxone , reflux disease, benzodiazepine use disorder (per chart review), anxiety, depression, prior smoking, alcohol and cannabis use disorder (per chart review and patient report), and obesity, who reports snoring and excessive daytime somnolence, as well as witnessed apneas, per wife. I reviewed your office note from 02/22/2023. He reports vivid dreams and acting out in his dreams including yelling out and flailing arms and legs Tylenol , Norvasc , Lipitor , Buprenorphine , Buspar , Catapres , Prozac , Cozaar , Toprol  - XL, Nitrostat , Zofran  ODT, Neurontin    Sleep Disorder      Comments   The patient came into the lab for a PSG. The patient took Tylenol  PM prior to start of study. The patient had two restroom breaks. EKG showed no obvious cardiac arrhythmias. Moderate to loud snoring. Snoring was louder while supine. All sleep stages witnessed. Respiratory events scored with a 3% desat. Majority of respiratory events were supine. Slept lateral and supine. AHI was 17.6 after 2hrs of TST. The patient did display arm jerking while in N2. The patient did have limb movements during REM. Also, patient was mumbling in REM.     Lights out: 09:59:19 PM Lights on: 05:19:05 AM   Time Total Supine Side Prone Upright  Recording (TRT) 7h 19.80m 2h 54.90m 4h 25.59m 0h 0.67m 0h 0.47m  Sleep (TST) 6h 1.15m 2h 20.30m 3h 41.21m 0h 0.67m 0h 0.58m   Latency N1 N2 N3 REM Onset Per. Slp. Eff.  Actual 0h 0.59m 0h 1.33m 0h 35.61m 6h 5.71m 0h 23.60m 0h 23.26m 82.14%   Stg Dur Wake N1 N2 N3 REM  Total 78.5 12.5 219.5 86.0 43.0  Supine 34.5 4.0 53.5 82.5 0.0  Side 44.0 8.5 166.0 3.5 43.0  Prone 0.0 0.0 0.0 0.0 0.0  Upright 0.0 0.0 0.0 0.0 0.0   Stg % Wake N1 N2 N3 REM  Total 17.9 3.5 60.8 23.8 11.9  Supine 7.8 1.1  14.8 22.9 0.0  Side 10.0 2.4 46.0 1.0 11.9  Prone 0.0 0.0 0.0 0.0 0.0  Upright 0.0 0.0 0.0 0.0 0.0     Apnea Summary Sub Supine Side Prone Upright  Total 20 Total 20 2 18  0 0    REM 9 0 9 0 0    NREM 11 2 9  0 0  Obs 17 REM 8 0 8 0 0    NREM 9 2 7  0 0  Mix 0 REM 0 0 0 0 0    NREM 0 0 0 0 0  Cen 3 REM 1 0 1 0 0    NREM 2 0 2 0 0   Rera Summary Sub Supine Side Prone Upright  Total 0 Total 0 0 0 0 0    REM  0 0 0 0 0    NREM 0 0 0 0 0   Hypopnea Summary Sub Supine Side Prone Upright  Total 140 Total 140 54 86 0 0    REM 24 0 24 0 0    NREM 116 54 62 0 0   4% Hypopnea Summary Sub Supine Side Prone Upright  Total (4%) 77 Total 77 31 46 0 0    REM 14 0 14 0 0    NREM 63 31 32 0 0     AHI Total Obs Mix Cen  26.59 Apnea 3.32 2.83 0.00 0.50   Hypopnea 23.27 -- -- --  16.12 Hypopnea (4%) 12.80 -- -- --    Total Supine Side Prone Upright  Position AHI 26.59 24.00 28.24 0.00 0.00  REM AHI 46.05   NREM AHI 23.96   Position RDI 26.59 24.00 28.24 0.00 0.00  REM RDI 46.05   NREM RDI 23.96    4% Hypopnea Total Supine Side Prone Upright  Position AHI (4%) 16.12 14.14 17.38 0.00 0.00  REM AHI (4%) 32.09   NREM AHI (4%) 13.96   Position RDI (4%) 16.12 14.14 17.38 0.00 0.00  REM RDI (4%) 32.09   NREM RDI (4%) 13.96    Desaturation Information Threshold: 2% <100% <90% <80% <70% <60% <50% <40%  Supine 153.0 100.0 1.0 0.0 0.0 0.0 0.0  Side 256.0 35.0 0.0 0.0 0.0 0.0 0.0  Prone 0.0 0.0 0.0 0.0 0.0 0.0 0.0  Upright 0.0 0.0 0.0 0.0 0.0 0.0 0.0  Total 409.0 135.0 1.0 0.0 0.0 0.0 0.0  Index 59.0 19.5 0.1 0.0 0.0 0.0 0.0   Threshold: 3% <100% <90% <80% <70% <60% <50% <40%  Supine 90.0 54.0 1.0 0.0 0.0 0.0 0.0  Side 161.0 30.0 0.0 0.0 0.0 0.0 0.0  Prone 0.0 0.0 0.0 0.0 0.0 0.0 0.0  Upright 0.0 0.0 0.0 0.0 0.0 0.0 0.0  Total 251.0 84.0 1.0 0.0 0.0 0.0 0.0  Index 36.2 12.1 0.1 0.0 0.0 0.0 0.0   Threshold: 4% <100% <90% <80% <70% <60% <50% <40%  Supine 54.0 37.0 1.0 0.0 0.0 0.0 0.0   Side 94.0 25.0 0.0 0.0 0.0 0.0 0.0  Prone 0.0 0.0 0.0 0.0 0.0 0.0 0.0  Upright 0.0 0.0 0.0 0.0 0.0 0.0 0.0  Total 148.0 62.0 1.0 0.0 0.0 0.0 0.0  Index 21.3 8.9 0.1 0.0 0.0 0.0 0.0   Threshold: 3% <100% <90% <80% <70% <60% <50% <40%  Supine 90 54 1 0 0 0 0  Side 161 30 0 0 0 0 0  Prone 0 0 0 0 0 0 0  Upright 0 0 0 0 0 0 0  Total 251 84 1 0 0 0 0   Awakening/Arousal Information # of Awakenings 16  Wake after sleep onset 55.48m  Wake after persistent sleep 55.72m   Arousal Assoc. Arousals Index  Apneas 6 1.0  Hypopneas 34 5.7  Leg Movements 0 0.0  Snore 0 0.0  PTT Arousals 0 0.0  Spontaneous 50 8.3  Total 88 14.6  Leg Movement Information PLMS LMs Index  Total LMs during PLMS 0 0.0  LMs w/ Microarousals 0 0.0   LM LMs Index  w/ Microarousal 0 0.0  w/ Awakening 0 0.0  w/ Resp Event 0 0.0  Spontaneous 2 0.3  Total 2 0.3     Desaturation threshold setting: 3% Minimum desaturation setting: 10 seconds SaO2 nadir: 77% The longest event was a 51 sec obstructive Hypopnea with a  minimum SaO2 of 91%. The lowest SaO2 was 77% associated with a 10 sec obstructive Apnea. EKG Rates EKG Avg Max Min  Awake 70 132 54  Asleep 65 90 53  EKG Events: Tachycardia

## 2024-01-16 ENCOUNTER — Ambulatory Visit (INDEPENDENT_AMBULATORY_CARE_PROVIDER_SITE_OTHER): Payer: PRIVATE HEALTH INSURANCE | Admitting: Student

## 2024-01-16 ENCOUNTER — Other Ambulatory Visit: Payer: Self-pay

## 2024-01-16 ENCOUNTER — Encounter: Payer: Self-pay | Admitting: Student

## 2024-01-16 VITALS — BP 103/54 | HR 65 | Temp 98.0°F | Ht 70.0 in | Wt 290.0 lb

## 2024-01-16 DIAGNOSIS — I201 Angina pectoris with documented spasm: Secondary | ICD-10-CM

## 2024-01-16 DIAGNOSIS — I10 Essential (primary) hypertension: Secondary | ICD-10-CM | POA: Diagnosis not present

## 2024-01-16 DIAGNOSIS — R0609 Other forms of dyspnea: Secondary | ICD-10-CM | POA: Insufficient documentation

## 2024-01-16 MED ORDER — FUROSEMIDE 20 MG PO TABS: 20.0000 mg | ORAL_TABLET | Freq: Every day | ORAL | 0 refills | Status: DC

## 2024-01-16 NOTE — Addendum Note (Signed)
 Addended by: Manfred Seed on: 01/16/2024 05:25 PM   Modules accepted: Orders

## 2024-01-16 NOTE — Assessment & Plan Note (Signed)
Stable.  No angina  

## 2024-01-16 NOTE — Assessment & Plan Note (Signed)
 103/54 today.  Continue clonidine  0.1 mg twice daily, losartan  50 mg daily, metoprolol  succinate 25 mg daily.

## 2024-01-16 NOTE — Assessment & Plan Note (Addendum)
 Worsening dyspnea on exertion over the last several weeks.  Signs and symptoms suggest cardiac etiology, wonder about subacute heart failure.  He is well-perfused at present with mild clinical volume overload.  Note grade 2 diastolic dysfunction on prior echo.  History notable for coronary artery vasospasm.  BNP today.  Trial furosemide  20 mg daily.  Chest x-ray requested.  If workup is not suggestive of cardiac cause, consider pulmonary function testing.

## 2024-01-16 NOTE — Patient Instructions (Signed)
 Start taking furosemide  20 mg daily. Call the clinic to tell me if it's helping or not in a week's time.  Return in about 1 month (around 02/15/2024) for dyspnea on exertion.  Remember to bring all of the medications that you take (including over the counter medications and supplements) with you to every clinic visit.  This after visit summary is an important review of tests, referrals, and medication changes that were discussed during your visit. If you have questions or concerns, call [phone]. Outside of clinic business hours, call the main hospital at [phone] and ask the operator for the on-call internal medicine resident.   Adria Hopkins MD 01/16/2024, 3:28 PM
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
## 2024-01-17 ENCOUNTER — Other Ambulatory Visit: Payer: Self-pay | Admitting: Student

## 2024-01-17 ENCOUNTER — Ambulatory Visit: Payer: Self-pay | Admitting: Student

## 2024-01-17 DIAGNOSIS — N529 Male erectile dysfunction, unspecified: Secondary | ICD-10-CM

## 2024-01-17 LAB — BRAIN NATRIURETIC PEPTIDE: BNP: 71.2 pg/mL (ref 0.0–100.0)

## 2024-01-17 NOTE — Progress Notes (Signed)
 Internal Medicine Clinic Attending  I was physically present during the key portions of the resident provided service and participated in the medical decision making of patient's management care. I reviewed pertinent patient test results.  The assessment, diagnosis, and plan were formulated together and I agree with the documentation in the resident's note.  Cherylene Corrente, MD

## 2024-01-18 ENCOUNTER — Ambulatory Visit: Payer: Self-pay | Admitting: Internal Medicine

## 2024-01-18 NOTE — Telephone Encounter (Signed)
-----   Message from Debbra Fairy sent at 01/15/2024  3:41 PM EDT ----- Patient referred by Dr. Berry Bristol, seen by me on 04/11/23, diagnostic PSG on 12/26/23.    Please call and notify the patient that the recent sleep study showed moderate to severe obstructive sleep apnea. I recommend treatment for in the form of autoPAP. We may consider at a CPAP titration  study at a later date, if need be, which means, that we would ask him to come back in for a second sleep study with CPAP treatment. For now, I would like to start him on a so-called autoPAP machine  at home, through a DME company (of his choice, or as per insurance requirement). The DME representative will educate him on how to use the machine, how to put the mask on, etc. I have placed an order  in the chart. Please send referral, talk to patient, send report to referring MD. We will need a FU in sleep clinic for 10 weeks post-PAP set up, please arrange that with me or one of our NPs.  Thanks,   Debbra Fairy, MD, PhD [facility])    ----- Message ----- From: Debbra Fairy, MD Sent: 01/15/2024   3:39 PM EDT To: Debbra Fairy, MD

## 2024-01-18 NOTE — Telephone Encounter (Signed)
 Pt has returned call to RN re: sleep results

## 2024-01-18 NOTE — Telephone Encounter (Signed)
 Pt returned call.  I gave him the results of the sleep study.  Moderate to severe OSA.  Recommend autopap but may bring back in later to cpap titration.  Use the machine every night for 4 hrs minimum, but medically 7-8- hrs is better.  [facility] is the DME I have sent to.  Please call [phone] if not heard back in a week.  Appt made for initial cpap 03-26-2024 at 0900 VV with MM/NP.

## 2024-01-18 NOTE — Telephone Encounter (Signed)
Called pt and LVM with office number asking for call back.  

## 2024-01-19 ENCOUNTER — Ambulatory Visit: Payer: Self-pay | Admitting: Student

## 2024-01-19 DIAGNOSIS — R0609 Other forms of dyspnea: Secondary | ICD-10-CM

## 2024-01-19 NOTE — Telephone Encounter (Signed)
 Zott, Ova Bloomer, Jeppie Moles, RN; Arnold, Tammy; Zott, [place]; Pearson Bounds Pulled the order and had to call pt for current insurance information as all insurance listed on his demographics is showing termed. He said he now has [employer], we will need to call this insurance tomorrow to verify we are in network. I will let you know either way.     Previous Messages    ----- Message ----- From: Viktoria Gray, RN Sent: 01/18/2024   3:22 PM EDT To: Michaele Adjutant; Loyce Ruffini Zott Subject: new autopap user for moderate to severe osa    New order in [facility] S. Alcorn Jolena Nay Male, 48 y.o., 1976/05/02 MRN: [id number] Phone: [phone]   Thanks,  Francisco Irving

## 2024-01-19 NOTE — Telephone Encounter (Signed)
 Zott, Nicky Barrack, RN thank you I am able to see it, looks like they bill thru [employer] so we should be in network but will still call just to verify. I will let you know if we are not in network     Previous Messages    ----- Message ----- From: Viktoria Gray, RN Sent: 01/19/2024   8:01 AM EDT To: Loyce Ruffini Zott Subject: FW: new autopap user for moderate to severe *  In epic under media tab 01-15-2024 image jpg.  Can you view?  Raynelle Callow RN ----- Message ----- From: Zott, Stacy Sent: 01/18/2024   5:20 PM EDT To: Michaele Adjutant; Stacy Zott; San* Subject: RE: new autopap user for moderate to severe *  Pulled the order and had to call pt for current insurance information as all insurance listed on his demographics is showing termed. He said he now has [employer], we will need to call this insurance tomorrow to verify we are in network. I will let you know either way. ----- Message ----- From: Viktoria Gray, RN Sent: 01/18/2024   3:22 PM EDT To: Michaele Adjutant; Stacy Zott Subject: new autopap user for moderate to severe osa    New order in [facility] [name] [name], 48 y.o., [name] MRN: [id number] Phone: [phone]   Thanks,  Raynelle Callow RN

## 2024-01-27 ENCOUNTER — Other Ambulatory Visit: Payer: Self-pay | Admitting: Student

## 2024-01-27 DIAGNOSIS — F515 Nightmare disorder: Secondary | ICD-10-CM

## 2024-02-01 ENCOUNTER — Encounter: Payer: PRIVATE HEALTH INSURANCE | Admitting: Student

## 2024-02-07 ENCOUNTER — Ambulatory Visit: Payer: Self-pay | Admitting: Student

## 2024-02-07 ENCOUNTER — Ambulatory Visit (HOSPITAL_COMMUNITY)
Admission: RE | Admit: 2024-02-07 | Discharge: 2024-02-07 | Disposition: A | Discharge status: Home / Self Care | Payer: PRIVATE HEALTH INSURANCE | Source: Ambulatory Visit | Attending: Internal Medicine | Admitting: Internal Medicine

## 2024-02-07 DIAGNOSIS — R0609 Other forms of dyspnea: Secondary | ICD-10-CM | POA: Insufficient documentation

## 2024-02-07 NOTE — Telephone Encounter (Signed)
 FYI Only or Action Required?: FYI only for provider.  Patient was last seen in primary care on 01/16/2024 by Norrine Sharper, MD.  Called Nurse Triage reporting Shortness of Breath.  Symptoms began a week ago.   Triage Disposition: See HCP Within 4 Hours (Or PCP Triage)                     Patient/caregiver understands and will follow disposition?:  Reason for Disposition  [1] MILD difficulty breathing (e.g., minimal/no SOB at rest, SOB with walking, pulse <100) AND [2] NEW-onset or WORSE than normal  Answer Assessment - Initial Assessment Questions This RN spoke with pt spouse, Quincy. This RN advised pt spouse for pt to be seen today based off symptoms. This RN advised pt goes to an urgent care. Pt spouse agreeable. Pt spouse requests an appt be scheduled for pt as a follow up. This RN told pt spouse this does not take place of urgent care visit. Pt spouse states understanding. Pt scheduled for an appt in office on 7/10 this week.          1. RESPIRATORY STATUS: Describe your breathing? (e.g., wheezing, shortness of breath, unable to speak, severe coughing)      Shortness of breath intermittent 2. ONSET: When did this breathing problem begin?      Last week, Thursday and Friday 3. PATTERN Does the difficult breathing come and go, or has it been constant since it started?      Comes and goes 4. SEVERITY: How bad is your breathing? (e.g., mild, moderate, severe)    - MILD: No SOB at rest, mild SOB with walking, speaks normally in sentences, can lie down, no retractions, pulse < 100.    - MODERATE: SOB at rest, SOB with minimal exertion and prefers to sit, cannot lie down flat, speaks in phrases, mild retractions, audible wheezing, pulse 100-120.    - SEVERE: Very SOB at rest, speaks in single words, struggling to breathe, sitting hunched forward, retractions, pulse > 120      Mild, only when moving around 5. RECURRENT SYMPTOM: Have you had  difficulty breathing before? If Yes, ask: When was the last time? and What happened that time?      No 6. CARDIAC HISTORY: Do you have any history of heart disease? (e.g., heart attack, angina, bypass surgery, angioplasty)      He has two arteries conjoined, instead of three 7. LUNG HISTORY: Do you have any history of lung disease?  (e.g., pulmonary embolus, asthma, emphysema)     Not that I know of 8. CAUSE: What do you think is causing the breathing problem?      No 9. OTHER SYMPTOMS: Do you have any other symptoms? (e.g., dizziness, runny nose, cough, chest pain, fever)     Rib pain, fatigue, dizziness when moving around too much; a couple of days has mentioned chest pain; keeping nitroglycerin  on hand  Protocols used: Breathing Difficulty-A-AH

## 2024-02-09 ENCOUNTER — Ambulatory Visit (INDEPENDENT_AMBULATORY_CARE_PROVIDER_SITE_OTHER): Payer: PRIVATE HEALTH INSURANCE | Admitting: Student

## 2024-02-09 VITALS — BP 126/79 | HR 75 | Temp 97.6°F | Ht 70.0 in | Wt 299.2 lb

## 2024-02-09 DIAGNOSIS — R0781 Pleurodynia: Secondary | ICD-10-CM

## 2024-02-09 MED ORDER — LIDOCAINE 5 % EX PTCH: 1.0000 | MEDICATED_PATCH | CUTANEOUS | 0 refills | Status: AC

## 2024-02-09 NOTE — Patient Instructions (Signed)
 Thank you so much for coming to the clinic today!   I have sent in some lidocaine  patches for your pain.    If you have any questions please feel free to the call the clinic at anytime at [phone]. It was a pleasure seeing you!  Best, Dr. Ahlana Slaydon
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
## 2024-02-09 NOTE — Assessment & Plan Note (Signed)
 Pt presents for acute visit for right sided rib pain. States that he woke up in the middle of the night with a sharp pain that gets exacerbated by movements such as raising his arm up. He has not tried anything for the pain, and doesn't believe he has had any injuries or falls to the area. On my exam, it's his right fifth rib is very tender to palpation, and I don't see any scars or vesicles. He has no history of shingles either. His pain seems MSK in nature, however unclear what caused this to begin with. Recent chest x-ray did not show any rib fractures. Will treat conservatively with lidocaine  patches.

## 2024-02-17 ENCOUNTER — Encounter: Payer: Self-pay | Admitting: Cardiology

## 2024-02-17 ENCOUNTER — Ambulatory Visit: Payer: PRIVATE HEALTH INSURANCE | Attending: Cardiology | Admitting: Cardiology

## 2024-02-17 VITALS — BP 108/70 | HR 90 | Ht 70.5 in | Wt 292.0 lb

## 2024-02-17 DIAGNOSIS — E66813 Obesity, class 3: Secondary | ICD-10-CM

## 2024-02-17 DIAGNOSIS — Q2381 Bicuspid aortic valve: Secondary | ICD-10-CM

## 2024-02-17 DIAGNOSIS — E7800 Pure hypercholesterolemia, unspecified: Secondary | ICD-10-CM | POA: Diagnosis not present

## 2024-02-17 DIAGNOSIS — M792 Neuralgia and neuritis, unspecified: Secondary | ICD-10-CM

## 2024-02-17 DIAGNOSIS — I77810 Thoracic aortic ectasia: Secondary | ICD-10-CM

## 2024-02-17 DIAGNOSIS — Z6841 Body Mass Index (BMI) 40.0 and over, adult: Secondary | ICD-10-CM

## 2024-02-17 DIAGNOSIS — G4733 Obstructive sleep apnea (adult) (pediatric): Secondary | ICD-10-CM | POA: Diagnosis not present

## 2024-02-17 MED ORDER — ROSUVASTATIN CALCIUM 20 MG PO TABS: 20.0000 mg | ORAL_TABLET | Freq: Every day | ORAL | 3 refills | Status: DC

## 2024-02-17 NOTE — Progress Notes (Signed)
 Internal Medicine Clinic Attending  Case discussed with the resident at the time of the visit.  We reviewed the resident's history and exam and pertinent patient test results.  I agree with the assessment, diagnosis, and plan of care documented in the resident's note.

## 2024-02-17 NOTE — Patient Instructions (Signed)
 Medication Instructions:  Your physician has recommended you make the following change in your medication: stop Atorvastatin  Start Rosuvastatin 20 mg by mouth daily   *If you need a refill on your cardiac medications before your next appointment, please call your pharmacy*  Lab Work: Have lab work checked at the lab on the first floor today--Lipids If you have labs (blood work) drawn today and your tests are completely normal, you will receive your results only by: MyChart Message (if you have MyChart) OR A paper copy in the mail If you have any lab test that is abnormal or we need to change your treatment, we will call you to review the results.  Testing/Procedures: none  Follow-Up: At [facility], you and your health needs are our priority.  As part of our continuing mission to provide you with exceptional heart care, our providers are all part of one team.  This team includes your primary Cardiologist (physician) and Advanced Practice Providers or APPs (Physician Assistants and Nurse Practitioners) who all work together to provide you with the care you need, when you need it.  Your next appointment:   12 month(s)  Provider:   Gordy Bergamo, MD    We recommend signing up for the patient portal called MyChart.  Sign up information is provided on this After Visit Summary.  MyChart is used to connect with patients for Virtual Visits (Telemedicine).  Patients are able to view lab/test results, encounter notes, upcoming appointments, etc.  Non-urgent messages can be sent to your provider as well.   To learn more about what you can do with MyChart, go to [email or web address].   Other Instructions You have been referred to see the pharmacist in our office.  Please schedule new patient appointment

## 2024-02-17 NOTE — Progress Notes (Signed)
 Cardiology Office Note:  .   Date:  02/18/2024  ID:  [name], DOB 04-06-1976, MRN [id number] PCP: Marylu Gee, DO  [facility] Providers Cardiologist:  Gordy Bergamo, MD   History of Present Illness: .   [name] is a 48 y.o. Caucasian male with prior tobacco use disorder 10-pack-year history of smoking quit smoking in 2022, bicuspid aortic valve with mild to moderate aortic stenosis, primary hypertension, bipolar disorder, chronic back pain and anxiety and depression.  He has normal coronary arteries by angiogram 09/24/2020.  Last echocardiogram 05/31/2023 revealing LVEF 65 to 70% and bicuspid aortic valve with moderate aortic stenosis and moderate aortic regurgitation.  Ascending aorta measured 39 mm.  On his last office visit due to memory issues and balance issues, I recommended holding atorvastatin  for 2 months to see whether there is improvement in memory otherwise considered neurologic evaluation.  Also if symptoms improved recommended we change out to Crestor  to see whether he would tolerate that better.  Sleep study was ordered and patient was to see GI specialist due to persistent nausea.  This is a 66-month office visit.  Sleep study performed 12/26/2023: Moderate to severe obstructive sleep apnea Dysfunctions associated with sleep stages or arousal from sleep No specific complaints except for right-sided chest discomfort that started about 4 to 6 weeks ago.  States that his memory issues and balance issues has resolved since stopping atorvastatin .  He has remained abstinent from tobacco use.  Labs   Lab Results  Component Value Date   CHOL 143 02/17/2024   HDL 32 (L) 02/17/2024   LDLCALC 84 02/17/2024   TRIG 152 (H) 02/17/2024   CHOLHDL 4.5 02/17/2024   Lab Results  Component Value Date   CHOL 116 05/31/2023   HDL 29 (L) 05/31/2023   LDLCALC 68 05/31/2023   TRIG 100 05/31/2023   CHOLHDL 6.3 01/11/2023   No results found for: LIPOA  Lab Results   Component Value Date   NA 140 01/04/2024   K 4.7 01/04/2024   CO2 21 01/04/2024   GLUCOSE 96 01/04/2024   BUN 13 01/04/2024   CREATININE 0.78 01/04/2024   CALCIUM  9.3 01/04/2024   EGFR 109 01/04/2024   GFRNONAA >60 10/25/2023      Latest Ref Rng & Units 01/04/2024   11:46 AM 10/25/2023    7:28 AM 10/25/2023    7:18 AM  BMP  Glucose 70 - 99 mg/dL 96   866   BUN 6 - 24 mg/dL 13   15   Creatinine 9.23 - 1.27 mg/dL 9.21   9.20   BUN/Creat Ratio 9 - 20 17     Sodium 134 - 144 mmol/L 140  139  139   Potassium 3.5 - 5.2 mmol/L 4.7  3.5  3.7   Chloride 96 - 106 mmol/L 103   104   CO2 20 - 29 mmol/L 21   24   Calcium  8.7 - 10.2 mg/dL 9.3   9.0       Latest Ref Rng & Units 01/04/2024   11:46 AM 10/25/2023    7:28 AM 10/25/2023    7:18 AM  CBC  WBC 3.4 - 10.8 x10E3/uL 7.7   7.9   Hemoglobin 13.0 - 17.7 g/dL 86.1  86.0  85.8   Hematocrit 37.5 - 51.0 % 41.5  41.0  40.6   Platelets 150 - 450 x10E3/uL 253   173    Lab Results  Component Value Date   HGBA1C 5.6  01/11/2023    Lab Results  Component Value Date   TSH 2.150 01/28/2023    ROS  Review of Systems  Cardiovascular:  Positive for chest pain (rightsided neuropathic pain). Negative for dyspnea on exertion and leg swelling.   Physical Exam:   VS:  BP 108/70 (BP Location: Right Arm, Patient Position: Sitting)   Pulse 90   Ht 5' 10.5 (1.791 m)   Wt 292 lb (132.5 kg)   SpO2 94%   BMI 41.31 kg/m    Wt Readings from Last 3 Encounters:  02/17/24 292 lb (132.5 kg)  02/09/24 299 lb 3.2 oz (135.7 kg)  01/16/24 290 lb (131.5 kg)    Physical Exam Neck:     Vascular: No carotid bruit or JVD.  Cardiovascular:     Rate and Rhythm: Normal rate and regular rhythm.     Pulses: Intact distal pulses.     Heart sounds: Murmur heard.     Early systolic murmur is present with a grade of 2/6 at the upper right sternal border.     No gallop.  Pulmonary:     Effort: Pulmonary effort is normal.     Breath sounds: Normal breath sounds.   Chest:  Breasts:    Right: Tenderness (right lower chest and right hypogastrium in dermatomal distribution) present.  Abdominal:     General: Bowel sounds are normal.     Palpations: Abdomen is soft.  Musculoskeletal:     Right lower leg: No edema.     Left lower leg: No edema.    Studies Reviewed: .    CT angiogram chest abdomen and pelvis 02/16/2023: 1. No evidence for aortic dissection. 2. Aortic valve calcification evident with 4.2 cm ascending thoracic. Cardiovascular: Pre contrast imaging shows no hyperdense crescent in the wall of the thoracic aorta to suggest the presence of an acute intramural hematoma. Ascending thoracic aorta measures 4.2 cm diameter. No dissection of the thoracic aorta. Calcification of the aortic valve evident.  ECHOCARDIOGRAM COMPLETE 05/31/2023  1. Left ventricular ejection fraction, by estimation, is 65 to 70%. The left ventricle has normal function. The left ventricle has no regional wall motion abnormalities. There is mild concentric left ventricular hypertrophy. Left ventricular diastolic parameters are consistent with Grade II diastolic dysfunction (pseudonormalization). 2. Right ventricular systolic function is normal. The right ventricular size is normal. Tricuspid regurgitation signal is inadequate for assessing PA pressure. 3. Left atrial size was moderately dilated. 4. The mitral valve is normal in structure. No evidence of mitral valve regurgitation. No evidence of mitral stenosis. 5. The aortic valve is bicuspid. There is moderate calcification of the aortic valve. There is moderate thickening of the aortic valve. Aortic valve regurgitation is moderate. Moderate aortic valve stenosis. Aortic regurgitation PHT measures 312 msec. Aortic valve mean gradient measures 25.0 mmHg. Aortic valve Vmax measures 3.39 m/s. Aortic valve acceleration time measures 113 msec. 6. There is borderline dilatation of the ascending aorta, measuring 39 mm.  Sleep study  12/26/2023: Moderate to severe obstructive sleep apnea Dysfunctions associated with sleep stages or arousal from sleep and sleep fragmentation.  Patient being followed by Dr. True Mar.  Chest x-ray two-view for shortness of breath 02/07/2024: Lungs are clear. No pleural effusion. No pneumothorax. Unchanged cardiomediastinal silhouette. No acute osseous findings. EKG:         Medications ordered    Meds ordered this encounter  Medications   rosuvastatin  (CRESTOR ) 20 MG tablet    Sig: Take 1 tablet (20 mg total) by  mouth daily.    Dispense:  90 tablet    Refill:  3    Discontinue Atorvastatin      ASSESSMENT AND PLAN: .      ICD-10-CM   1. Bicuspid aortic valve  Q23.81     2. Aortic root dilatation (HCC)  I77.810     3. Hypercholesteremia  E78.00 rosuvastatin  (CRESTOR ) 20 MG tablet    Lipid Profile    4. OSA (obstructive sleep apnea)  G47.33 AMB Referral to [facility]    5. Class 3 severe obesity due to excess calories with serious comorbidity and body mass index (BMI) of 40.0 to 44.9 in adult  E66.813 AMB Referral to [facility]   Z68.41     6. Neuropathic pain of chest  M79.2       Assessment and Plan 1. Bicuspid aortic valve (Primary) Patient has bicuspid aortic valve and has mild to moderate aortic regurgitation and moderate aortic stenosis, no change in physical exam, continue to monitor this, office visit in a year and will consider repeating echocardiogram at that time.  2. Aortic root dilatation (HCC) Very mild aortic root dilatation that is associated with bicuspid aortic valve needs monitoring as ascending aortic aneurysms are common in bicuspid aortic valve.  There is only borderline dilatation at 42 mm.  3. Hypercholesteremia Atorvastatin  was held for 2 months with improvement in his mental state fogginess and confusion, lipids obtained today, not sure whether he has held the medication or not however we will go ahead and switch him to Crestor  20  mg daily, goal LDL <100 in the absence of documented CAD, PAD or diabetes mellitus. - rosuvastatin  (CRESTOR ) 20 MG tablet; Take 1 tablet (20 mg total) by mouth daily.  Dispense: 90 tablet; Refill: 3  4. Obstructive sleep apnea Sleep study on 12/26/2023 revealing moderate to severe sleep apnea, patient very motivated for using CPAP.  Awaiting patient supply.  5. Class 3 severe obesity due to excess calories with serious comorbidity and body mass index (BMI) of 40.0 to 44.9 in adult Discussed weight loss again, patient having difficulty with weight loss.  Will refer him to [facility] for initiation of GLP-1 therapy in view of morbid obesity and OSA.  6. Neuropathic pain of chest Patient complains of right chest pain that has been ongoing for the past 4 to 6 weeks, chest x-ray reviewed, normal.  No herpetic lesions although pain appears to be in dermatomal distribution.  Could consider a trial of Neurontin , PCP message sent.  Office visit in a year or sooner if problems.  Okay to start Testosterone  from cardiac standpoint.  Signed,  Gordy Bergamo, MD, [facility] 02/18/2024, 5:42 AM [facility] [address] [place], [place] [address] Phone: [phone]. Fax:  [phone]

## 2024-02-18 ENCOUNTER — Ambulatory Visit: Payer: Self-pay | Admitting: Cardiology

## 2024-02-18 LAB — LIPID PANEL
Chol/HDL Ratio: 4.5 ratio (ref 0.0–5.0)
Cholesterol, Total: 143 mg/dL (ref 100–199)
HDL: 32 mg/dL — ABNORMAL LOW (ref >39)
LDL Chol Calc (NIH): 84 mg/dL (ref 0–99)
Triglycerides: 152 mg/dL — ABNORMAL HIGH (ref 0–149)
VLDL Cholesterol Cal: 27 mg/dL (ref 5–40)

## 2024-02-20 ENCOUNTER — Other Ambulatory Visit: Payer: Self-pay | Admitting: Student

## 2024-02-20 ENCOUNTER — Other Ambulatory Visit: Payer: Self-pay

## 2024-02-20 ENCOUNTER — Ambulatory Visit (INDEPENDENT_AMBULATORY_CARE_PROVIDER_SITE_OTHER): Payer: PRIVATE HEALTH INSURANCE | Admitting: Student

## 2024-02-20 ENCOUNTER — Encounter: Payer: Self-pay | Admitting: Student

## 2024-02-20 VITALS — BP 102/59 | HR 94 | Temp 98.2°F | Ht 70.5 in | Wt 297.8 lb

## 2024-02-20 DIAGNOSIS — R0781 Pleurodynia: Secondary | ICD-10-CM | POA: Diagnosis not present

## 2024-02-20 DIAGNOSIS — F3341 Major depressive disorder, recurrent, in partial remission: Secondary | ICD-10-CM

## 2024-02-20 DIAGNOSIS — G4733 Obstructive sleep apnea (adult) (pediatric): Secondary | ICD-10-CM | POA: Diagnosis not present

## 2024-02-20 MED ORDER — SERTRALINE HCL 50 MG PO TABS: 100.0000 mg | ORAL_TABLET | Freq: Every day | ORAL | 3 refills | Status: DC

## 2024-02-20 MED ORDER — TIRZEPATIDE-WEIGHT MANAGEMENT 2.5 MG/0.5ML ~~LOC~~ SOLN
2.5000 mg | SUBCUTANEOUS | 3 refills | Status: DC
Start: 2024-02-20 — End: 2024-03-05

## 2024-02-20 MED ORDER — LIDOCAINE 5 % EX PTCH
1.0000 | MEDICATED_PATCH | CUTANEOUS | 0 refills | Status: AC
Start: 2024-02-20 — End: 2024-03-05
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
## 2024-02-20 NOTE — Assessment & Plan Note (Signed)
 Diagnosed on 12/26/23, sleep study showed moderate to severe OSA. Given concomitant morbid obesity, he would benefit from initiation of Zepbound . Will prescribe, and slowly taper up.

## 2024-02-20 NOTE — Assessment & Plan Note (Signed)
 Switch from Prozac  to Zoloft  has helped him greatly. His mood is much better and he feels more determined to finish tasks. His sexual function has also improved. Discussed increase of Zoloft  today, with potential side effect of worsening sexual dysfunction. He is agreeable to try, but will split it up and have him take 50mg  twice (100mg  total), and if he notices any worsening of symptoms he can resume his 50mg  dose.   Plan:  - Increase zoloft  to 100mg 

## 2024-02-20 NOTE — Assessment & Plan Note (Signed)
 Still present, but states lidocaine  patches have helped greatly. Will refill for him.

## 2024-02-20 NOTE — Patient Instructions (Signed)
 Thank you so much for coming to the clinic today!   I am sending the Zoloft , please take two of them and if you're having side effects it's ok to go back to 50mg  I have sent in more lidocaine  patches  We are starting you on Zepbound , please let us  know how it's going at your month follow up  If you have any questions please feel free to the call the clinic at anytime at [phone]. It was a pleasure seeing you!  Best, Dr. Zacheriah Stumpe

## 2024-02-21 ENCOUNTER — Other Ambulatory Visit: Payer: Self-pay

## 2024-02-21 ENCOUNTER — Ambulatory Visit: Payer: PRIVATE HEALTH INSURANCE

## 2024-02-21 VITALS — BP 107/61 | HR 105 | Temp 98.7°F | Ht 70.5 in | Wt 294.8 lb

## 2024-02-21 DIAGNOSIS — S161XXA Strain of muscle, fascia and tendon at neck level, initial encounter: Secondary | ICD-10-CM | POA: Diagnosis not present

## 2024-02-21 MED ORDER — METHOCARBAMOL 500 MG PO TABS: 500.0000 mg | ORAL_TABLET | Freq: Two times a day (BID) | ORAL | 0 refills | Status: DC | PRN

## 2024-02-21 NOTE — Progress Notes (Signed)
 Internal Medicine Clinic Attending  Case discussed with the resident at the time of the visit.  We reviewed the resident's history and exam and pertinent patient test results.  I agree with the assessment, diagnosis, and plan of care documented in the resident's note.    Will increase Zepbound  every 4 weeks as tolerated. Patient could meet with our clinical pharmacist in 4 weeks to check in on the weight loss & how he's tolerating the medicine, with plan to increase if tolerating.
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
## 2024-02-21 NOTE — Assessment & Plan Note (Addendum)
 Patient's acute symptoms concerning for cervical muscle strain.  On exam the patient has tightness of muscles overlying left scapula.  Negative empty can, Spurling, liftoff tests.  No signs of focal neurologic deficits.  The patient does have pain in cervical spine upon flexion of the neck.  Not likely to be rotator cuff-related.  Patient is not describing any symptoms related to migraines.  Denies photophobia, phonophobia, numbness/tingling, or other sensory deficits. The patient's pain may also be related to inflammation overlying cervical spine as well as muscle strain of upper back muscles.  Explained to the patient that exercises for his neck would likely reduce his pain.  He would like to start with home exercises and did not want a referral to physical therapy. Plan: - Short course of NSAIDs (1-2 weeks) such as Aleve - Short course of Robaxin  500 mg up to 2 times per day as needed - Counseled patient on exercises he can perform at home -Will consider future PT referral if the patient's pain does not improve and he is amenable

## 2024-02-21 NOTE — Patient Instructions (Signed)
 Thank you, [name] for allowing us  to provide your care today. Today we discussed the following:  - You likely have an irritation of the nerves in your cervical spine and tightness of the muscles overlying your upper back. - Try taking a short course of NSAIDs like Alleve which you can buy over the counter. Only take this medicine for a maximum of 1-2 weeks. - You can also try taking the muscle relaxant called Robaxin  (methocarbamol ) that I have prescribed. Please only take up to 2 tablets per day. - I have also included some stretching exercises for you. If you would like a referral to a physical therapist, please call the office back.  I have ordered the following medication/changed the following medications:   Stop the following medications: There are no discontinued medications.   Start the following medications: Meds ordered this encounter  Medications   methocarbamol  (ROBAXIN ) 500 MG tablet    Sig: Take 1 tablet (500 mg total) by mouth 2 (two) times daily as needed for muscle spasms.    Dispense:  60 tablet    Refill:  0     Follow up: as needed    Remember: IF YOUR PAIN GETS WORSE, PLEASE CALL THE CLINIC. IF YOU START TO EXPERIENCE CHANGES IN VISION, DOUBLE VISION, FOCAL WEAKNESS OF YOUR ARMS OR LEGS, PLEASE GO TO THE EMERGENCY DEPARTMENT.  Should you have any questions or concerns please call the Internal Medicine Clinic at [phone].     Chau Savell, MD [facility]

## 2024-02-22 ENCOUNTER — Other Ambulatory Visit (HOSPITAL_COMMUNITY): Payer: Self-pay

## 2024-02-22 NOTE — Telephone Encounter (Signed)
 Per pharmacy -  Pharmacy comment:    Alternative Requested:NOT COVERD PLEASE ADVISE.

## 2024-02-22 NOTE — Progress Notes (Signed)
 Internal Medicine Clinic Attending  I was physically present during the key portions of the resident provided service and participated in the medical decision making of patient's management care. I reviewed pertinent patient test results.  The assessment, diagnosis, and plan were formulated together and I agree with the documentation in the resident's note.  Lovie Clarity, MD    I agree that most likely diagnosis is acute cervical muscle strain. Patient has neck pain & stiffness, with tenderness to palpation over the paraspinal muscles, and pain with neck movement. He has no numbness, tingling, or weakness of his arm or hand. No recent trauma and no systemic signs of illness.

## 2024-02-22 NOTE — Telephone Encounter (Signed)
 Pt was called - no answer, left message Zepbound  rx was sent to [facility] on 7/21 and to call for any other questions.

## 2024-02-22 NOTE — Telephone Encounter (Signed)
 Copied from CRM #[id number]. Topic: Clinical - Prescription Issue >> Feb 22, 2024 12:06 PM Diannia H wrote: Reason for CRM: Patient called to check on his rx for his Tirzepatide -Weight Management 2.5 MG/0.5ML (INJECT 2.5 MG SUBCUTANEOUSLY WEEKLY), I informed him that he is waiting on his provider, could you assist and let him know what's going on with his medicines, patients callback number is [phone].

## 2024-02-23 ENCOUNTER — Telehealth: Payer: Self-pay

## 2024-02-23 ENCOUNTER — Other Ambulatory Visit: Payer: Self-pay | Admitting: Student

## 2024-02-23 NOTE — Telephone Encounter (Signed)
 [name] (Key: [id number]) Zepbound  2.5MG /0.5ML pen-injectors Form Passenger transport manager PA Form (2017 NCPDP) Created Sent to Plan Plan Response Submit Clinical Questions Determination Message from Plan Message from [facility]: Drug is not covered by plan

## 2024-02-23 NOTE — Telephone Encounter (Signed)
 I submitted a PA per cover my meds.SABRA [place] Pulis (Key: [id number]) Zepbound  2.5MG /0.5ML pen-injectors Form Passenger transport manager PA Form (2017 NCPDP) Created Sent to Plan Plan Response Submit Clinical Questions Determination Message from Plan Message from [facility]: Drug is not covered by plan

## 2024-02-23 NOTE — Telephone Encounter (Signed)
 Prior Authorization for patient (Zepbound  2.5MG /0.5ML pen-injectors) came through on cover my meds was submitted awaiting approval or denial.  XZB:[id number]

## 2024-03-01 ENCOUNTER — Other Ambulatory Visit: Payer: PRIVATE HEALTH INSURANCE

## 2024-03-01 DIAGNOSIS — N529 Male erectile dysfunction, unspecified: Secondary | ICD-10-CM

## 2024-03-02 LAB — TESTOSTERONE: Testosterone: 51 ng/dL — ABNORMAL LOW (ref 264–916)

## 2024-03-05 ENCOUNTER — Other Ambulatory Visit: Payer: Self-pay | Admitting: Student

## 2024-03-05 ENCOUNTER — Telehealth: Payer: Self-pay | Admitting: *Deleted

## 2024-03-05 MED ORDER — TIRZEPATIDE-WEIGHT MANAGEMENT 2.5 MG/0.5ML ~~LOC~~ SOLN: 2.5000 mg | SUBCUTANEOUS | 3 refills | Status: AC
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
## 2024-03-22 ENCOUNTER — Ambulatory Visit: Payer: Self-pay | Admitting: Student
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
## 2024-03-26 ENCOUNTER — Telehealth: Payer: Self-pay | Admitting: *Deleted

## 2024-03-26 ENCOUNTER — Telehealth (INDEPENDENT_AMBULATORY_CARE_PROVIDER_SITE_OTHER): Payer: PRIVATE HEALTH INSURANCE | Admitting: Adult Health

## 2024-03-26 DIAGNOSIS — G4733 Obstructive sleep apnea (adult) (pediatric): Secondary | ICD-10-CM | POA: Diagnosis not present

## 2024-03-26 NOTE — Telephone Encounter (Signed)
 Called [facility] to inquire re: pt's new pap machine. LVM asking for call back.

## 2024-03-26 NOTE — Telephone Encounter (Signed)
 Called [facility] and spoke with Danielle. Refurbished machine is $700 with half due up front. Danielle said pt did have a one time $38 co-pay but his monthly cost is only 9.82 x 6 months. She will call the patient to clarify this. She also said they can bill pt's insurance for the $80 so he can get the machine. He will still have to pay it eventually.   Sherryl Bouchard, NP  P [facility] Call[facility] Please reach out to the different DME companies and see if there is a possibility getting a refurbished machine

## 2024-03-27 ENCOUNTER — Telehealth: Payer: Self-pay

## 2024-03-27 ENCOUNTER — Other Ambulatory Visit (HOSPITAL_COMMUNITY): Payer: Self-pay

## 2024-03-27 ENCOUNTER — Ambulatory Visit (INDEPENDENT_AMBULATORY_CARE_PROVIDER_SITE_OTHER): Payer: PRIVATE HEALTH INSURANCE

## 2024-03-27 ENCOUNTER — Telehealth: Payer: Self-pay | Admitting: *Deleted

## 2024-03-27 VITALS — BP 125/68 | HR 68 | Temp 98.2°F | Ht 70.5 in | Wt 284.2 lb

## 2024-03-27 DIAGNOSIS — R7989 Other specified abnormal findings of blood chemistry: Secondary | ICD-10-CM | POA: Diagnosis not present

## 2024-03-27 MED ORDER — TESTOSTERONE 40.5 MG/2.5GM (1.62%) TD GEL: 40.5000 mg | Freq: Every day | TRANSDERMAL | 1 refills | Status: DC

## 2024-03-27 NOTE — Telephone Encounter (Signed)
 Prior Authorization for patient (Testosterone  40.5 MG/2.5GM(1.62%) gel) came through on cover my meds was submitted with last office notes and labs awaiting approval or denial.  KEY:[id number]

## 2024-03-27 NOTE — Addendum Note (Signed)
 Addended by: Claudetta Sallie L on: 03/27/2024 03:00 PM   Modules accepted: Orders

## 2024-03-27 NOTE — Progress Notes (Signed)
 Established Patient Office Visit  Subjective   Patient ID: [name], male    DOB: 1976-01-19  Age: 48 y.o. MRN: [id number]  Chief Complaint  Patient presents with   Testosterone     Check testosterone      This is a 48 year old male with a history of HTN, bipolar disorder, OSA, MDD/GAD, tobacco use disorder (quit 3 years ago), bicuspid aortic valve with moderate aortic stenosis who presents to discuss starting testosterone .  The patient states that he has had at least a year-long difficulty of trying to achieve an erection, which is impacting his libido and his sexual function.  He has tried multiple measures to help gain his libido back, including increasing his Zoloft  dose, exercising with a punching bag daily, diet modifications, and decreasing his stress load.  However despite these measures, he still reports decreased energy, decreased sexual function, and a mental fogginess that he cannot seem to shake.  He notes that he had lowered testosterone  levels on a blood track earlier this year, and is inquiring about starting testosterone  today.  Additionally, he mentions that his Zepbound  that he was previously prescribed was not covered by his insurance despite his wife being approved for Zepbound  on the same insurance policy.    Review of Systems  Constitutional:  Negative for chills and fever.  Eyes:  Negative for blurred vision and double vision.  Respiratory:  Negative for shortness of breath.   Cardiovascular:  Negative for chest pain, palpitations and leg swelling.  Gastrointestinal:  Negative for abdominal pain, nausea and vomiting.  Musculoskeletal:  Positive for back pain (chronic, not bothersome today).  Neurological:  Negative for dizziness and headaches.  Psychiatric/Behavioral:  The patient is nervous/anxious.       Objective:     BP 125/68 (BP Location: Left Arm, Patient Position: Sitting, Cuff Size: Normal)   Pulse 68   Temp 98.2 F (36.8 C) (Oral)   Ht 5'  10.5 (1.791 m)   Wt 284 lb 3.2 oz (128.9 kg)   SpO2 98%   BMI 40.20 kg/m   Physical Exam Constitutional:      Appearance: Normal appearance.  HENT:     Mouth/Throat:     Mouth: Mucous membranes are moist.  Cardiovascular:     Rate and Rhythm: Normal rate and regular rhythm.     Pulses: Normal pulses.     Heart sounds: Murmur heard.  Pulmonary:     Effort: Pulmonary effort is normal.     Breath sounds: Normal breath sounds.  Chest:     Chest wall: No tenderness.  Abdominal:     General: Bowel sounds are normal.     Palpations: Abdomen is soft.  Musculoskeletal:     Cervical back: Normal range of motion.  Skin:    General: Skin is warm and dry.  Neurological:     General: No focal deficit present.     Mental Status: He is alert and oriented to person, place, and time.  Psychiatric:        Mood and Affect: Mood normal.        Behavior: Behavior normal.      Assessment & Plan:   Problem List Items Addressed This Visit   None Visit Diagnoses       Low testosterone  in male    -  Primary   Relevant Medications   Testosterone  40.5 MG/2.5GM (1.62%) GEL   Other Relevant Orders   CBC no Diff   Testosterone , Total   PSA  Patient seen with Dr. Mliss Foot.  Low Testosterone  Patient has previous history of decreased sexual function and low libido, despite efforts in losing weight, switching antidepressant, and tolerating an increased dose of Zoloft  without side effects. Given patient's prior lab draws proving unequivocal hypogonadism, will start him on low dose testosterone . With patient's history of coronary artery spasms, prefer transdermal to intramuscular testosterone .  Discussed with patient risks of starting testosterone  such as pulmonary embolism, increased blood pressure, and increased risk of atrial fibrillation.  Discussed ordering PSA and a possibility of false positives, but will further assess if lab work results and elevated PSA. --Start transdermal  testosterone  gel 1.62% applied once daily to shoulders and upper arms --Check CBC, serum testosterone , and PSA --Return in 2 weeks to monitor BP and signs of thromboembolism   Hyperlipidemia Chronic, controlled. Patient previously on Atorvastatin  40mg  daily, however experienced balance and memory issues that he presented to his cardiologist last month. Was switched to Rosuvastatin  20mg  daily, will continue to monitor with patient. Lipid panel 1 month ago shows LDL 84 and total cholesterol 143. -- Continue rosuvastatin  20 mg, refill as needed  MDD/GAD Patient still taking Zoloft  100mg , states his mood remains better and has noticed his libido and sexual function improve slightly. Will continue Zoloft  100mg  and monitor for any sexual dysfunction bothersome to the patient.  - Continue Zoloft  100mg  daily  Obstructive Sleep Apnea Patient prescribed Zepbound  1 month ago, states has not been able to start it due to denial of prior authorization by insurance. Discussed with Lavern Ancona about getting this medication covered for obstructive sleep apnea. Zepbound  is not on formulary list for this patient's insurance, and will not be able to get this in the future despite meeting the criteria for OSA and obesity. Unfortunately, no other GLP-1s are covered by the patient's insurance company, nor does he meet the inclusion criteria for Mounjaro  to be covered. Cannot start phenetermine-topiramate combination due to patient's history of coronary artery spasms due to the medication's sympathomimetic effects. Consider starting GLP-1 in the future if patient's insurance coverage changes.    Erie Sica, DO Internal Medicine Resident, PGY-1 [facility] 10:09 AM 03/27/2024

## 2024-03-27 NOTE — Telephone Encounter (Signed)
 Return call to patient message was left that we are awaiting the PA for the medication.  PA was sent in this morning.  If denied will need to see what his insurance will cover.   Copied from CRM #[id number]. Topic: Clinical - Medication Question >> Mar 27, 2024 11:17 AM Miquel SAILOR wrote: Reason for CRM: Testosterone  40.5 MG/2.5GM (1.62%) GEL [[id number] seen 08/26 PT girlfriend [place] stated pharmacy needs PCP approval for medication before refill or alternative medication covered by insurance.Needs call back  [phone]

## 2024-03-27 NOTE — Patient Instructions (Addendum)
 Thank you, [name] for allowing us  to provide your care today. Today we discussed starting testosterone  gel to help with your energy and libido.     New medications: -Transdermal testosterone  1.62% gel, apply to clean, dry shoulders and upper arms daily  I have ordered the following labs for you:  Lab Orders         CBC no Diff         Testosterone , Total         PSA       I will call if any are abnormal. All of your labs can be accessed through My Chart.   My Chart Access: [email or web address]  Please follow-up in: 2 weeks to monitor serum testosterone , CBC, BP, and signs of clot.    We look forward to seeing you next time. Please call our clinic at [phone] if you have any questions or concerns. The best time to call is Monday-Friday from 9am-4pm, but there is someone available 24/7. If after hours or the weekend, call the main hospital number and ask for the Internal Medicine Resident On-Call. If you need medication refills, please notify your pharmacy one week in advance and they will send us  a request.   Thank you for letting us  take part in your care.   Wishing you the best!  Andre Fines, DO 03/27/2024, 9:41 AM [name]

## 2024-03-27 NOTE — Telephone Encounter (Signed)
 [name] (Key: [id number]) PA Case ID #: [id number] Rx #: [id number] Need Help? Call us  at [phone] Outcome Approved today by [employer] ESI 2017 CaseId:[id number];Status:Approved;Review Type:Prior Auth;Coverage Start Date:03/27/2024;Coverage End Date:03/27/2025; Effective Date: 03/27/2024 Authorization Expiration Date: 03/27/2025 Drug Testosterone  40.5 MG/2.5GM(1.62%) gel ePA cloud logo Form [employer] Commercial Electronic PA Form [phone] NCPDP) Original Claim Info 75 MD CALL HELP DESK

## 2024-03-28 ENCOUNTER — Ambulatory Visit: Payer: Self-pay

## 2024-03-28 LAB — CBC
Hematocrit: 44.2 % (ref 37.5–51.0)
Hemoglobin: 14.5 g/dL (ref 13.0–17.7)
MCH: 28.3 pg (ref 26.6–33.0)
MCHC: 32.8 g/dL (ref 31.5–35.7)
MCV: 86 fL (ref 79–97)
Platelets: 206 x10E3/uL (ref 150–450)
RBC: 5.13 x10E6/uL (ref 4.14–5.80)
RDW: 14.1 % (ref 11.6–15.4)
WBC: 10.3 x10E3/uL (ref 3.4–10.8)

## 2024-03-28 LAB — TESTOSTERONE: Testosterone: 89 ng/dL — ABNORMAL LOW (ref 264–916)

## 2024-03-28 LAB — PSA: Prostate Specific Ag, Serum: 0.1 ng/mL (ref 0.0–4.0)

## 2024-03-28 NOTE — Progress Notes (Signed)
 Testosterone  low at 89 on recheck. Believe starting the topical testosterone  will be beneficial for him as we now have 3 measurements persistently low. CBC normal, Hct and Hgb normal. PSA levels normal at 0.1.  Will continue to monitor.

## 2024-03-28 NOTE — Telephone Encounter (Signed)
 Call to Pharmacy.  Patient picked up yesterday.

## 2024-03-28 NOTE — Progress Notes (Signed)
 Internal Medicine Clinic Attending  I was physically present during the key portions of the resident provided service and participated in the medical decision making of patient's management care. I reviewed pertinent patient test results.  The assessment, diagnosis, and plan were formulated together and I agree with the documentation in the resident's note.  Lovie Clarity, MD    Main focus of visit today was hypogonadism.  Will start testosterone  gel Will see Dr Isobel again in a few weeks to ensure he has received the gel and that he's not having problems with use

## 2024-03-29 LAB — FOLLICLE STIMULATING HORMONE: FSH: 2.6 m[IU]/mL (ref 1.5–12.4)

## 2024-03-29 LAB — LUTEINIZING HORMONE: LH: 2.9 m[IU]/mL (ref 1.7–8.6)

## 2024-03-29 LAB — SPECIMEN STATUS REPORT

## 2024-03-29 NOTE — Progress Notes (Signed)
 Called and informed patient of normal LH and FSH. Patient able to pick up topical testosterone  and started this morning.

## 2024-03-30 ENCOUNTER — Other Ambulatory Visit: Payer: Self-pay | Admitting: Student

## 2024-04-04 ENCOUNTER — Telehealth: Payer: Self-pay | Admitting: Pharmacy Technician

## 2024-04-04 ENCOUNTER — Other Ambulatory Visit (HOSPITAL_COMMUNITY): Payer: Self-pay

## 2024-04-04 ENCOUNTER — Telehealth: Payer: Self-pay | Admitting: Pharmacist

## 2024-04-04 ENCOUNTER — Ambulatory Visit: Payer: PRIVATE HEALTH INSURANCE | Admitting: Pharmacist
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
## 2024-04-04 NOTE — Telephone Encounter (Signed)
 Weight loss GLP1 plan excluded. Inform patient. Will contact us  next year when he will change his insurance.

## 2024-04-04 NOTE — Telephone Encounter (Signed)
 Per pt calls  PER PA :

## 2024-04-10 ENCOUNTER — Ambulatory Visit: Payer: PRIVATE HEALTH INSURANCE

## 2024-04-10 VITALS — BP 124/62 | HR 67 | Temp 97.9°F | Ht 70.5 in | Wt 286.8 lb

## 2024-04-10 DIAGNOSIS — E291 Testicular hypofunction: Secondary | ICD-10-CM | POA: Diagnosis not present

## 2024-04-10 NOTE — Progress Notes (Signed)
 Established Patient Office Visit  Subjective   Patient ID: [name], male    DOB: [birth year]  Age: 48 y.o. MRN: [id number]  Chief Complaint  Patient presents with   Follow-up    Testosterone     [name] is a 48 year old male with a past medical history of HTN, OSA, MDD/GAD, tobacco use disorder (quit 3 years ago), and bicuspid aortic valve with moderate aortic stenosis who presents to the clinic today for follow up after starting topical testosterone . Please see problem-based assessment and plans for details.    Review of Systems  Constitutional: Negative.   Respiratory: Negative.    Cardiovascular: Negative.   Gastrointestinal: Negative.   Genitourinary: Negative.   Musculoskeletal: Negative.   Neurological: Negative.   Endo/Heme/Allergies: Negative.   Psychiatric/Behavioral: Negative.       Objective:    BP 124/62 (BP Location: Right Arm, Patient Position: Sitting, Cuff Size: Normal)   Pulse 67   Temp 97.9 F (36.6 C) (Oral)   Ht 5' 10.5 (1.791 m)   Wt 286 lb 12.8 oz (130.1 kg)   SpO2 94%   BMI 40.57 kg/m   Physical Exam Constitutional:      Appearance: Normal appearance.     Comments: Pleasant  HENT:     Mouth/Throat:     Mouth: Mucous membranes are moist.  Cardiovascular:     Rate and Rhythm: Normal rate and regular rhythm.     Pulses: Normal pulses.     Heart sounds: Murmur heard.  Pulmonary:     Effort: Pulmonary effort is normal. No respiratory distress.     Breath sounds: Normal breath sounds. No wheezing.  Chest:     Chest wall: No tenderness.  Abdominal:     General: Bowel sounds are normal.     Palpations: Abdomen is soft.  Musculoskeletal:        General: Normal range of motion.     Cervical back: Normal range of motion.  Skin:    General: Skin is warm and dry.     Capillary Refill: Capillary refill takes less than 2 seconds.  Neurological:     General: No focal deficit present.     Mental Status: He is alert and oriented  to person, place, and time.  Psychiatric:        Mood and Affect: Mood normal.        Behavior: Behavior normal.     No results found for any visits on 04/10/24.   The 10-year ASCVD risk score (Arnett DK, et al., 2019) is: 3.4%   Assessment & Plan:   Patient seen with Dr. Mliss Foot.  Secondary Hypogonadism Last visit's LH and FSH norma, patient had persistently low testosterone  levels at 89  His hematocrit and hemoglobin were both normal. PSA was normal at 0.1.  Today patient reports feeling well overall. He has not noticed any adverse effects, like worsening sleep apnea, weight gain or any problems with application.  He denies any shortness of breath, dizziness, worsening headaches, palpitations, skin irritation of the application site, leg swelling or pain.  His mood has been stable on his Zoloft  100 mg and he has not noticed any changes.  He still has not seen substantial changes while on the testosterone , but notes that this will take some time before he can notice any improvement. Informed patient we are monitoring his testosterone  level to ensure it normalizes, of which patient understands. Will check serum testosterone  today. Discussed with patient how testosterone  use can  affect fertility by decreasing spermatogenesis, but this is not a reliable source of birth control, and to take additional preventative measures if needed. Discussed the need to monitor hematocrit and hemoglobin on a regular basis given testosterone  use, patient voiced understanding and is agreeable. He will follow-up in clinic with me during his next visit to maintain continuity. - Serum testosterone  - 2-3 month follow-up to check H&H   Jilene Spohr, DO Internal Medicine Resident, PGY-1 9:41 AM 04/10/2024

## 2024-04-10 NOTE — Patient Instructions (Addendum)
 Thank you, [name] for allowing us  to provide your care today. Today we discussed how the testosterone  is working for you.   I have ordered the following labs for you:  Lab Orders         Testosterone        I will call if any are abnormal. All of your labs can be accessed through My Chart.   My Chart Access: [email or web address]  Please follow-up in: 3 months to monitor your blood count and monitor any signs or symptoms of problems with the testosterone  use.  If you notice any shortness of breath, increased palpitations, or swelling of your legs, please call the office for sooner appointment.    We look forward to seeing you next time. Please call our clinic at [phone] if you have any questions or concerns. The best time to call is Monday-Friday from 9am-4pm, but there is someone available 24/7. If after hours or the weekend, call the main hospital number and ask for the Internal Medicine Resident On-Call. If you need medication refills, please notify your pharmacy one week in advance and they will send us  a request.   Thank you for letting us  take part in your care. Wishing you the best!  Danen Lapaglia, DO 04/10/2024, 9:33 AM [name]

## 2024-04-11 ENCOUNTER — Ambulatory Visit: Payer: Self-pay

## 2024-04-11 LAB — TESTOSTERONE: Testosterone: 452 ng/dL (ref 264–916)

## 2024-04-11 NOTE — Progress Notes (Signed)
 Serum testosterone  normalized to 452. Informed patient to continue with topical gel packet application once daily in the mornings.

## 2024-04-11 NOTE — Progress Notes (Signed)
 Internal Medicine Clinic Attending  I was physically present during the key portions of the resident provided service and participated in the medical decision making of patient's management care. I reviewed pertinent patient test results.  The assessment, diagnosis, and plan were formulated together and I agree with the documentation in the resident's note.  Lovie Clarity, MD    Mr Robarts is doing well since starting him on topical testosterone .  Labs demonstrate that testosterone  raised from <100 to >400 today - this fits with his improved symptoms.  We will continue current dose (normal range is 450-600)

## 2024-04-17 ENCOUNTER — Other Ambulatory Visit: Payer: Self-pay | Admitting: Student

## 2024-04-24 ENCOUNTER — Other Ambulatory Visit: Payer: Self-pay | Admitting: Student

## 2024-04-26 ENCOUNTER — Other Ambulatory Visit: Payer: Self-pay | Admitting: Student

## 2024-05-16 ENCOUNTER — Other Ambulatory Visit: Payer: Self-pay | Admitting: *Deleted

## 2024-05-16 DIAGNOSIS — R7989 Other specified abnormal findings of blood chemistry: Secondary | ICD-10-CM

## 2024-05-16 NOTE — Telephone Encounter (Signed)
 Received from [facility] that they had made multiple attempts to contact pt regarding treatment of OSA , they have not been able to reach him.  He as no showed a couple of appts (set up).  Feeling that he is not interested in therapy at this time.

## 2024-05-21 MED ORDER — TESTOSTERONE 40.5 MG/2.5GM (1.62%) TD GEL: 40.5000 mg | Freq: Every day | TRANSDERMAL | 1 refills | Status: AC

## 2024-05-21 NOTE — Telephone Encounter (Signed)
 LOV 04/10/24

## 2024-05-21 NOTE — Telephone Encounter (Unsigned)
 Copied from CRM [id number]. Topic: Clinical - Medication Refill >> May 21, 2024 12:18 PM Merlynn A wrote: Medication: Testosterone  40.5 MG/2.5GM (1.62%) GEL  Has the patient contacted their pharmacy? Yes (Agent: If no, request that the patient contact the pharmacy for the refill. If patient does not wish to contact the pharmacy document the reason why and proceed with request.) (Agent: If yes, when and what did the pharmacy advise?)  This is the patient's preferred pharmacy:  [id number] - [place], [place] - [address] AT [facility] [address] [place] [address] Phone: [phone] Fax: [phone]  Is this the correct pharmacy for this prescription? Yes If no, delete pharmacy and type the correct one.   Has the prescription been filled recently? No  Is the patient out of the medication? Yes  Has the patient been seen for an appointment in the last year OR does the patient have an upcoming appointment? Yes  Can we respond through MyChart? Yes  Agent: Please be advised that Rx refills may take up to 3 business days. We ask that you follow-up with your pharmacy.

## 2024-05-21 NOTE — Telephone Encounter (Signed)
 See med request from patient. Wrong department in encounter- unable to pend. Med is prescribed by IMP  Copied from CRM #[id number]. Topic: Clinical - Medication Refill >> May 21, 2024 12:18 PM Merlynn A wrote: Medication: Testosterone  40.5 MG/2.5GM (1.62%) GEL   Has the patient contacted their pharmacy? Yes (Agent: If no, request that the patient contact the pharmacy for the refill. If patient does not wish to contact the pharmacy document the reason why and proceed with request.) (Agent: If yes, when and what did the pharmacy advise?)   This is the patient's preferred pharmacy:  [id number] - [place], [place] - [address] AT [facility] [address] [place] [address] Phone: [phone] Fax: [phone]

## 2024-05-21 NOTE — Addendum Note (Signed)
 Addended by: Gianlucca Szymborski on: 05/21/2024 03:28 PM   Modules accepted: Orders

## 2024-06-07 NOTE — Progress Notes (Unsigned)
   Established Patient Office Visit  Subjective   Patient ID: [name], male    DOB: [birth year]  Age: 48 y.o. MRN: [id number]  No chief complaint on file.   [name] is a 48 year old male with a past medical history of HTN, OSA, MDD/GAD, tobacco use disorder (quit 3 years ago), and bicuspid aortic valve with moderate aortic stenosis who presents to the clinic today for follow up after starting topical testosterone . Please see problem-based assessment and plans for details.    ROS    Objective:    There were no vitals taken for this visit. Physical Exam   No results found for any visits on 06/08/24.    The 10-year ASCVD risk score (Arnett DK, et al., 2019) is: 3.4%    Assessment & Plan:   Patient seen with {IMTSattending2025/2026:32924}.  Problem List Items Addressed This Visit   None   No follow-ups on file.      Mavi Un, DO Internal Medicine Resident, PGY-1 7:05 PM 06/07/2024

## 2024-06-07 NOTE — Patient Instructions (Signed)
 Thank you, [name] for allowing us  to provide your care today. Today we discussed your blood pressure, testosterone  use, and your sleep terrors.    Referrals: -Amb referral to psychiatry to discuss Cognitive Behavorial Therapy and help address your sleep terrors   I have ordered the following labs for you:  Lab Orders         Comprehensive metabolic panel with GFR       I will call if any are abnormal. All of your labs can be accessed through My Chart.   My Chart Access: [email or web address]  Please follow-up in: 3 months to check your blood pressure    We look forward to seeing you next time. Please call our clinic at [phone] if you have any questions or concerns. The best time to call is Monday-Friday from 9am-4pm, but there is someone available 24/7. If after hours or the weekend, call the main hospital number and ask for the Internal Medicine Resident On-Call. If you need medication refills, please notify your pharmacy one week in advance and they will send us  a request.   Thank you for letting us  take part in your care. Wishing you the best!  Kearsten Ginther, DO 06/08/2024, 11:17 AM [name]

## 2024-06-08 ENCOUNTER — Other Ambulatory Visit: Payer: Self-pay

## 2024-06-08 ENCOUNTER — Ambulatory Visit (INDEPENDENT_AMBULATORY_CARE_PROVIDER_SITE_OTHER): Payer: PRIVATE HEALTH INSURANCE

## 2024-06-08 VITALS — BP 133/75 | HR 74 | Temp 98.4°F | Ht 71.0 in | Wt 291.0 lb

## 2024-06-08 DIAGNOSIS — R0789 Other chest pain: Secondary | ICD-10-CM

## 2024-06-08 DIAGNOSIS — Z79899 Other long term (current) drug therapy: Secondary | ICD-10-CM

## 2024-06-08 DIAGNOSIS — F514 Sleep terrors [night terrors]: Secondary | ICD-10-CM | POA: Diagnosis not present

## 2024-06-08 DIAGNOSIS — I10 Essential (primary) hypertension: Secondary | ICD-10-CM

## 2024-06-08 NOTE — Assessment & Plan Note (Addendum)
 BP today 133/75. On Amlodipine  10mg  , Losartan  50mg , Metoprolol  succinate 25mg  daily, Clonidine  0.1mg  BID. Last seen by cardiology at 02/17/24. Due for follow up in 2026. Reports no side effects, and overall feels his BP is controlled, and has not noticed any changes in his BP since being on topical testosterone .  -Continue antihypertensive regimen  -CMP today -Follow up in 3 months
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
## 2024-06-08 NOTE — Assessment & Plan Note (Addendum)
 Patient is still having persistent right sided pain under his ribs. This pain will sometimes wake him from sleep and feel like he is being stretched. He will wake in the night from the pain and need to stretch himself to relieve the pain. Has tried lidocaine  patches historically, which have helped. During hospitalization in 01/2023, was noted to have hepatic steatosis on CT, but no gallbladder thickening or gallstones. Last CMP in 10/2023 showed AST/ALT/ALP and total bilirubin WNL. Will recheck CMP today. Patient unsure if his pain correlates with food consumption, but notes he typically will eat healthily and air fry foods vs eating fatty or greasy things. Discussed with patient his pain is likely MSK in nature due to it being reproducible on exam and location, however if persistent, will order RUQ US  to rule out gallbladder involvement. Patient amenable to plan.  -CMP today

## 2024-06-09 LAB — COMPREHENSIVE METABOLIC PANEL WITH GFR
ALT: 24 IU/L (ref 0–44)
AST: 23 IU/L (ref 0–40)
Albumin: 4.4 g/dL (ref 4.1–5.1)
Alkaline Phosphatase: 78 IU/L (ref 47–123)
BUN/Creatinine Ratio: 16 (ref 9–20)
BUN: 12 mg/dL (ref 6–24)
Bilirubin Total: 0.3 mg/dL (ref 0.0–1.2)
CO2: 24 mmol/L (ref 20–29)
Calcium: 9 mg/dL (ref 8.7–10.2)
Chloride: 102 mmol/L (ref 96–106)
Creatinine, Ser: 0.76 mg/dL (ref 0.76–1.27)
Globulin, Total: 2.7 g/dL (ref 1.5–4.5)
Glucose: 108 mg/dL — ABNORMAL HIGH (ref 70–99)
Potassium: 4.7 mmol/L (ref 3.5–5.2)
Sodium: 139 mmol/L (ref 134–144)
Total Protein: 7.1 g/dL (ref 6.0–8.5)
eGFR: 110 mL/min/1.73 (ref >59)

## 2024-06-11 ENCOUNTER — Ambulatory Visit: Payer: Self-pay

## 2024-06-11 NOTE — Progress Notes (Signed)
 CMP WNL, stable. No changes needed at this time.

## 2024-06-18 NOTE — Progress Notes (Signed)
 Internal Medicine Clinic Attending  I was physically present during the key portions of the resident provided service and participated in the medical decision making of patient's management care. I reviewed pertinent patient test results.  The assessment, diagnosis, and plan were formulated together and I agree with the documentation in the resident's note.  Rosan Dayton BROCKS, DO

## 2024-06-19 ENCOUNTER — Other Ambulatory Visit: Payer: Self-pay

## 2024-06-19 DIAGNOSIS — S161XXA Strain of muscle, fascia and tendon at neck level, initial encounter: Secondary | ICD-10-CM

## 2024-06-29 ENCOUNTER — Other Ambulatory Visit: Payer: Self-pay | Admitting: Student

## 2024-07-02 NOTE — Telephone Encounter (Signed)
 Pharmacy requesting a 90 day supply  Medication sent to pharmacy.

## 2024-07-22 ENCOUNTER — Other Ambulatory Visit: Payer: Self-pay | Admitting: Student

## 2024-08-08 ENCOUNTER — Telehealth: Payer: PRIVATE HEALTH INSURANCE | Admitting: Physician Assistant

## 2024-08-08 DIAGNOSIS — K219 Gastro-esophageal reflux disease without esophagitis: Secondary | ICD-10-CM | POA: Diagnosis not present

## 2024-08-08 DIAGNOSIS — A084 Viral intestinal infection, unspecified: Secondary | ICD-10-CM

## 2024-08-08 MED ORDER — ONDANSETRON 4 MG PO TBDP: 4.0000 mg | ORAL_TABLET | Freq: Three times a day (TID) | ORAL | 0 refills | Status: AC | PRN

## 2024-08-08 MED ORDER — PANTOPRAZOLE SODIUM 40 MG PO TBEC: 40.0000 mg | DELAYED_RELEASE_TABLET | Freq: Every day | ORAL | 0 refills | Status: AC

## 2024-08-08 NOTE — Progress Notes (Signed)
 " Virtual Visit Consent   [name], you are scheduled for a virtual visit with a [facility] provider today. Just as with appointments in the office, your consent must be obtained to participate. Your consent will be active for this visit and any virtual visit you may have with one of our providers in the next 365 days. If you have a MyChart account, a copy of this consent can be sent to you electronically.  As this is a virtual visit, video technology does not allow for your provider to perform a traditional examination. This may limit your provider's ability to fully assess your condition. If your provider identifies any concerns that need to be evaluated in person or the need to arrange testing (such as labs, EKG, etc.), we will make arrangements to do so. Although advances in technology are sophisticated, we cannot ensure that it will always work on either your end or our end. If the connection with a video visit is poor, the visit may have to be switched to a telephone visit. With either a video or telephone visit, we are not always able to ensure that we have a secure connection.  By engaging in this virtual visit, you consent to the provision of healthcare and authorize for your insurance to be billed (if applicable) for the services provided during this visit. Depending on your insurance coverage, you may receive a charge related to this service.  I need to obtain your verbal consent now. Are you willing to proceed with your visit today? [name] has provided verbal consent on 08/08/2024 for a virtual visit (video or telephone). Andre Andre Dickinson, PA-C  Date: 08/08/2024 10:55 AM   Virtual Visit via Video Note   I, [name], connected with  [name]  ([id number], 10/20/1975) on 08/08/2024 at 10:45 AM EST by a video-enabled telemedicine application and verified that I am speaking with the correct person using two identifiers.  Location: Patient: Virtual Visit  Location Patient: Home Provider: Virtual Visit Location Provider: Home Office   I discussed the limitations of evaluation and management by telemedicine and the availability of in person appointments. The patient expressed understanding and agreed to proceed.    History of Present Illness: [name] is a 49 y.o. who identifies as a male who was assigned male at birth, and is being seen today for nausea, vomiting, diarrhea.  HPI: Abdominal Pain This is a new problem. The current episode started in the past 7 days (3 days with acute nausea, vomiting and diarrhea; however, has been having nausea worse in the mornings with some dry heaves for over a month-bending forward worsens). The onset quality is sudden. The problem occurs 2 to 4 times per day. The problem has been unchanged. The pain is located in the generalized abdominal region (current pain is generalized; however has chronic discomfort in the RUQ over the last month). The pain is mild. The quality of the pain is aching and cramping. The abdominal pain does not radiate. Associated symptoms include diarrhea, a fever (Monday night into Tuesday), nausea and vomiting (a lot of dry heaves, nausea is worse in the morning). Pertinent negatives include no anorexia, belching, constipation, flatus, hematochezia, melena or myalgias. The pain is aggravated by certain positions. The pain is relieved by Nothing. He has tried nothing for the symptoms. The treatment provided no relief.    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   Bilateral lower extremity edema  05/13/2023   Constipation 05/13/2023   Major depressive disorder, recurrent episode, in partial remission (HCC) with concurrent Anxiety 03/03/2023   Nonrheumatic aortic valve stenosis 02/17/2023   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   Aneurysm of ascending aorta without rupture 02/16/2023   Abnormal CT scan 02/16/2023   Benign hypertension 02/16/2023   Former smoker 02/16/2023   Cannabis use disorder 01/10/2023   Alcohol use disorder in remission 01/10/2023   Severe benzodiazepine use disorder (HCC) 01/10/2023   Severe opioid use disorder, in sustained remission, on maintenance therapy (HCC) 01/10/2023   Substance induced mood disorder (HCC) 01/09/2023   Coronary artery spasm 09/24/2020   Bicuspid aortic valve with ascending aorta 4.0 to 4.5 cm in diameter 09/24/2020   Nonspecific abnormal electrocardiogram (ECG) (EKG)    Nonrheumatic aortic valve insufficiency     Allergies: Allergies[1] Medications: Current Medications[2]  Observations/Objective: Patient is well-developed, well-nourished in no acute distress.  Resting comfortably at home.  Head is normocephalic, atraumatic.  No labored breathing.  Speech is clear and coherent with logical content.  Patient is alert and oriented at baseline.    Assessment and Plan: 1. Viral gastroenteritis (Primary) - ondansetron  (ZOFRAN -ODT) 4 MG disintegrating tablet; Take 1 tablet (4 mg total) by mouth every 8 (eight) hours as needed.  Dispense: 20 tablet; Refill: 0  2. Gastroesophageal reflux disease without esophagitis - pantoprazole  (PROTONIX ) 40 MG tablet; Take 1 tablet (40 mg total) by mouth daily.  Dispense: 30 tablet; Refill: 0  - Suspect acute Viral GI illness with chronic GERD/Gastritis - Zofran  for nausea - Push fluids, electrolyte beverages - Liquid diet, then increase to soft/bland (BRAT) diet over next day, then increase diet as tolerated - Add Pantoprazole  for chronic symptoms that have been present over a month (Morning nausea  worse with bending forward) - Seek in person evaluation if not improving or symptoms worsen   Follow Up Instructions: I discussed the assessment and treatment plan with the patient. The patient was provided an opportunity to ask questions and all were answered. The patient agreed with the plan and demonstrated an understanding of the instructions.  A copy of instructions were sent to the patient via MyChart unless otherwise noted below.    The patient was advised to call back or seek an in-person evaluation if the symptoms worsen or if the condition fails to improve as anticipated.    Andre Andre Dickinson, PA-C     [1]  Allergies Allergen Reactions   Atorvastatin  Other (See Comments)    Forgetfullness, fogginess   Gabapentin  Other (See Comments)    Jerky movements of extremities, unclear if true allergy  [2]  Current Outpatient Medications:    ondansetron  (ZOFRAN -ODT) 4 MG disintegrating tablet, Take 1 tablet (4 mg total) by mouth every 8 (eight) hours as needed., Disp: 20 tablet, Rfl: 0   pantoprazole  (PROTONIX ) 40 MG tablet, Take 1 tablet (40 mg total) by mouth daily., Disp: 30 tablet, Rfl: 0   amLODipine  (NORVASC ) 10 MG tablet, Take 10 mg by mouth daily., Disp: , Rfl:    Buprenorphine  HCl-Naloxone  HCl 12-3 MG FILM, Place 1 Film under the tongue 2 (  two) times daily., Disp: , Rfl:    cloNIDine  (CATAPRES ) 0.1 MG tablet, Take 1 tablet (0.1 mg total) by mouth 2 (two) times daily., Disp: 60 tablet, Rfl: 11   losartan  (COZAAR ) 50 MG tablet, Take 1 tablet (50 mg total) by mouth daily at 10 pm., Disp: 90 tablet, Rfl: 4   methocarbamol  (ROBAXIN ) 500 MG tablet, TAKE 1 TABLET (500 MG TOTAL) BY MOUTH 2 (TWO) TIMES DAILY AS NEEDED FOR MUSCLE SPASMS., Disp: 60 tablet, Rfl: 0   metoprolol  succinate (TOPROL -XL) 25 MG 24 hr tablet, Take 1 tablet (25 mg total) by mouth daily., Disp: 90 tablet, Rfl: 3   nitroGLYCERIN  (NITROSTAT ) 0.4 MG SL tablet, Place 1 tablet (0.4 mg total) under the tongue every 5  (five) minutes as needed for chest pain., Disp: 30 tablet, Rfl: 0   rosuvastatin  (CRESTOR ) 20 MG tablet, Take 1 tablet (20 mg total) by mouth daily., Disp: 90 tablet, Rfl: 3   senna (SENOKOT) 8.6 MG TABS tablet, Take 1 tablet (8.6 mg total) by mouth daily., Disp: 120 tablet, Rfl: 2   sertraline  (ZOLOFT ) 50 MG tablet, TAKE 2 TABLETS BY MOUTH EVERY DAY, Disp: 180 tablet, Rfl: 2   Testosterone  40.5 MG/2.5GM (1.62%) GEL, Place 40.5 mg onto the skin daily., Disp: 30 packet, Rfl: 1   tirzepatide  (ZEPBOUND ) 2.5 MG/0.5ML injection vial, Inject 2.5 mg into the skin once a week., Disp: 0.5 mL, Rfl: 3   traZODone  (DESYREL ) 50 MG tablet, TAKE 1 TABLET BY MOUTH EVERYDAY AT BEDTIME, Disp: 90 tablet, Rfl: 0  "

## 2024-08-08 NOTE — Patient Instructions (Addendum)
 " Carlin GORMAN Dubin, thank you for joining [name], PA-C for today's virtual visit.  While this provider is not your primary care provider (PCP), if your PCP is located in our provider database this encounter information will be shared with them immediately following your visit.   A [facility] MyChart account gives you access to today's visit and all your visits, tests, and labs performed at [facility]  click here if you don't have a  MyChart account or go to [email or web address]  Consent: (Patient) Carlin GORMAN Kimple provided verbal consent for this virtual visit at the beginning of the encounter.  Current Medications:  Current Outpatient Medications:    ondansetron  (ZOFRAN -ODT) 4 MG disintegrating tablet, Take 1 tablet (4 mg total) by mouth every 8 (eight) hours as needed., Disp: 20 tablet, Rfl: 0   pantoprazole  (PROTONIX ) 40 MG tablet, Take 1 tablet (40 mg total) by mouth daily., Disp: 30 tablet, Rfl: 0   amLODipine  (NORVASC ) 10 MG tablet, Take 10 mg by mouth daily., Disp: , Rfl:    Buprenorphine  HCl-Naloxone  HCl 12-3 MG FILM, Place 1 Film under the tongue 2 (two) times daily., Disp: , Rfl:    cloNIDine  (CATAPRES ) 0.1 MG tablet, Take 1 tablet (0.1 mg total) by mouth 2 (two) times daily., Disp: 60 tablet, Rfl: 11   losartan  (COZAAR ) 50 MG tablet, Take 1 tablet (50 mg total) by mouth daily at 10 pm., Disp: 90 tablet, Rfl: 4   methocarbamol  (ROBAXIN ) 500 MG tablet, TAKE 1 TABLET (500 MG TOTAL) BY MOUTH 2 (TWO) TIMES DAILY AS NEEDED FOR MUSCLE SPASMS., Disp: 60 tablet, Rfl: 0   metoprolol  succinate (TOPROL -XL) 25 MG 24 hr tablet, Take 1 tablet (25 mg total) by mouth daily., Disp: 90 tablet, Rfl: 3   nitroGLYCERIN  (NITROSTAT ) 0.4 MG SL tablet, Place 1 tablet (0.4 mg total) under the tongue every 5 (five) minutes as needed for chest pain., Disp: 30 tablet, Rfl: 0   rosuvastatin  (CRESTOR ) 20 MG tablet, Take 1 tablet (20 mg total) by mouth daily., Disp: 90  tablet, Rfl: 3   senna (SENOKOT) 8.6 MG TABS tablet, Take 1 tablet (8.6 mg total) by mouth daily., Disp: 120 tablet, Rfl: 2   sertraline  (ZOLOFT ) 50 MG tablet, TAKE 2 TABLETS BY MOUTH EVERY DAY, Disp: 180 tablet, Rfl: 2   Testosterone  40.5 MG/2.5GM (1.62%) GEL, Place 40.5 mg onto the skin daily., Disp: 30 packet, Rfl: 1   tirzepatide  (ZEPBOUND ) 2.5 MG/0.5ML injection vial, Inject 2.5 mg into the skin once a week., Disp: 0.5 mL, Rfl: 3   traZODone  (DESYREL ) 50 MG tablet, TAKE 1 TABLET BY MOUTH EVERYDAY AT BEDTIME, Disp: 90 tablet, Rfl: 0   Medications ordered in this encounter:  Meds ordered this encounter  Medications   ondansetron  (ZOFRAN -ODT) 4 MG disintegrating tablet    Sig: Take 1 tablet (4 mg total) by mouth every 8 (eight) hours as needed.    Dispense:  20 tablet    Refill:  0    Supervising Provider:   LAMPTEY, PHILIP O [[id number]]   pantoprazole  (PROTONIX ) 40 MG tablet    Sig: Take 1 tablet (40 mg total) by mouth daily.    Dispense:  30 tablet    Refill:  0    Supervising Provider:   LAMPTEY, PHILIP O [[id number]]     *If you need refills on other medications prior to your next appointment, please contact your pharmacy*  Follow-Up: Call back or seek an in-person evaluation if the symptoms worsen or  if the condition fails to improve as anticipated.  [facility] [phone]  Other Instructions  GERD in Adults: Diet Changes When you have gastroesophageal reflux disease (GERD), you may need to make changes to your diet. Choosing the right foods can help with your symptoms. Think about working with an expert in healthy eating called a dietitian. They can help you make healthy food choices. What are tips for following this plan? Reading food labels Look for foods that are low in saturated fat. Foods that may help with your symptoms include: Foods with less than 5% of daily value (DV) of fat. Foods with 0 grams of trans fat. Cooking [employer] in ways that  don't use a lot of fat. These ways include: Baking. Steaming. Grilling. Broiling. To add flavor, try to use herbs that are low in spice and acidity. Avoid frying your food. Meal planning  Eat small meals often rather than eating 3 large meals each day. Eat your meals slowly in a place where you feel relaxed. If told by your health care provider, avoid: Foods that cause symptoms. Keep a food diary to keep track of foods that cause symptoms. Alcohol. Drinking a lot of liquid with meals. General instructions For 2-3 hours after you eat, avoid: Bending over. Exercise. Lying down. Chew sugar-free gum after meals. What foods should I eat? Eat a healthy diet. Try to include: Foods with high amounts of fiber. These include: Fruits and vegetables. Whole grains and beans. Low-fat dairy products. Lean meats, fish, and poultry. Egg whites. Foods that cause symptoms in someone else may not cause symptoms for you. Work with your provider to find foods that are safe for you. The items listed above may not be all the foods and drinks you can have. Talk with a dietitian to learn more. The items listed above may not be a complete list of foods and beverages you can eat and drink. Contact a dietitian for more information. What foods should I avoid? Limiting some of these foods may help with your symptoms. Each person is different. Talk with a dietitian or your provider to help you find the exact foods to avoid. Some of the foods to avoid may include: Fruits Fruits with a lot of acid in them. These may include citrus fruits, such as oranges, grapefruit, pineapple, and lemons. Vegetables Deep-fried vegetables, such as French fries. Vegetables, sauces, or toppings made with added fat and vegetables with acid in them. These may include tomatoes and tomato products, chili peppers, onions, garlic, and horseradish. Grains Pastries or quick breads with added fat. Meats and other proteins High-fat  meats, such as fatty beef or pork, hot dogs, ribs, ham, sausage, salami, and bacon. Fried meat or protein, such as fried fish and fried chicken. Egg yolks. Fats and oils Butter. Margarine. Shortening. Ghee. Drinks Coffee and other drinks with caffeine in them. Fizzy and sugary drinks, such as soda and energy drinks. Fruit juice made with acidic fruits, such as orange or grapefruit. Tomato juice. Sweets and desserts Chocolate and cocoa. Donuts. Seasonings and condiments Mint, such as peppermint and spearmint. Condiments, herbs, or seasonings that cause symptoms. These may include curry, hot sauce, or vinegar-based salad dressings. The items listed above may not be all the foods and drinks you should avoid. Talk with a dietitian to learn more. Questions to ask your health care provider Changes to your diet and everyday life are often the first steps taken to manage symptoms of GERD. If these  changes don't help, talk with your provider about taking medicines. Where to find more information International Foundation for Gastrointestinal Disorders: aboutgerd.org This information is not intended to replace advice given to you by your health care provider. Make sure you discuss any questions you have with your health care provider. Document Revised: 05/31/2023 Document Reviewed: 12/15/2022 Elsevier Patient Education  2024 Elsevier Inc.  Viral Gastroenteritis, Adult  Viral gastroenteritis is also known as the stomach flu. This condition may affect your stomach, small intestine, and large intestine. It can cause sudden watery diarrhea, fever, and vomiting. This condition is caused by many different viruses. These viruses can be passed from person to person very easily (are contagious). Diarrhea and vomiting can make you feel weak and cause you to become dehydrated. You may not be able to keep fluids down. Dehydration can make you tired and thirsty, cause you to have a dry mouth, and decrease how often  you urinate. It is important to replace the fluids that you lose from diarrhea and vomiting. What are the causes? Gastroenteritis is caused by many viruses, including rotavirus and norovirus. Norovirus is the most common cause in adults. You can get sick after being exposed to the viruses from other people. You can also get sick by: Eating food, drinking water, or touching a surface contaminated with one of these viruses. Sharing utensils or other personal items with an infected person. What increases the risk? You are more likely to develop this condition if you: Have a weak body defense system (immune system). Live with one or more children who are younger than 2 years. Live in a nursing home. Travel on cruise ships. What are the signs or symptoms? Symptoms of this condition start suddenly 1-3 days after exposure to a virus. Symptoms may last for a few days or for as long as a week. Common symptoms include watery diarrhea and vomiting. Other symptoms include: Fever. Headache. Fatigue. Pain in the abdomen. Chills. Weakness. Nausea. Muscle aches. Loss of appetite. How is this diagnosed? This condition is diagnosed with a medical history and physical exam. You may also have a stool test to check for viruses or other infections. How is this treated? This condition typically goes away on its own. The focus of treatment is to prevent dehydration and restore lost fluids (rehydration). This condition may be treated with: An oral rehydration solution (ORS) to replace important salts and minerals (electrolytes) in your body. Take this if told by your health care provider. This is a drink that is sold at pharmacies and retail stores. Medicines to help with your symptoms. Probiotic supplements to reduce symptoms of diarrhea. Fluids given through an IV, if dehydration is severe. Older adults and people with other diseases or a weak immune system are at higher risk for dehydration. Follow these  instructions at home: Eating and drinking  Take an ORS as told by your health care provider. Drink clear fluids in small amounts as you are able. Clear fluids include: Water. Ice chips. Diluted fruit juice. Low-calorie sports drinks. Drink enough fluid to keep your urine pale yellow. Eat small amounts of healthy foods every 3-4 hours as you are able. This may include whole grains, fruits, vegetables, lean meats, and yogurt. Avoid fluids that contain a lot of sugar or caffeine, such as energy drinks, sports drinks, and soda. Avoid spicy or fatty foods. Avoid alcohol. General instructions  Wash your hands often, especially after having diarrhea or vomiting. If soap and water are not available, use hand  sanitizer. Make sure that all people in your household wash their hands well and often. Take over-the-counter and prescription medicines only as told by your health care provider. Rest at home while you recover. Watch your condition for any changes. Take a warm bath to relieve any burning or pain from frequent diarrhea episodes. Keep all follow-up visits. This is important. Contact a health care provider if you: Cannot keep fluids down. Have symptoms that get worse. Have new symptoms. Feel light-headed or dizzy. Have muscle cramps. Get help right away if you: Have chest pain. Have trouble breathing or you are breathing very quickly. Have a fast heartbeat. Feel extremely weak or you faint. Have a severe headache, a stiff neck, or both. Have a rash. Have severe pain, cramping, or bloating in your abdomen. Have skin that feels cold and clammy. Feel confused. Have pain when you urinate. Have signs of dehydration, such as: Dark urine, very little urine, or no urine. Cracked lips. Dry mouth. Sunken eyes. Sleepiness. Weakness. Have signs of bleeding, such as: Seeing blood in your vomit. Having vomit that looks like coffee grounds. Having bloody or black stools or stools that  look like tar. These symptoms may be an emergency. Get help right away. Call 911. Do not wait to see if the symptoms will go away. Do not drive yourself to the hospital. Summary Viral gastroenteritis is also known as the stomach flu. It can cause sudden watery diarrhea, fever, and vomiting. This condition can be passed from person to person very easily (is contagious). Take an oral rehydration solution (ORS) if told by your health care provider. This is a drink that is sold at pharmacies and retail stores. Wash your hands often, especially after having diarrhea or vomiting. If soap and water are not available, use hand sanitizer. This information is not intended to replace advice given to you by your health care provider. Make sure you discuss any questions you have with your health care provider. Document Revised: 05/18/2021 Document Reviewed: 05/18/2021 Elsevier Patient Education  2024 Elsevier Inc.   If you have been instructed to have an in-person evaluation today at a local Urgent Care facility, please use the link below. It will take you to a list of all of our available [facility], including address, phone number and hours of operation. Please do not delay care.  [facility]  If you or a family member do not have a primary care provider, use the link below to schedule a visit and establish care. When you choose a [facility] primary care physician or advanced practice provider, you gain a long-term partner in health. Find a Primary Care Provider  Learn more about [facility] in-office and virtual care options: [facility] Get Care Now "

## 2024-08-10 ENCOUNTER — Ambulatory Visit: Payer: Self-pay | Admitting: Student

## 2024-08-10 ENCOUNTER — Encounter: Payer: Self-pay | Admitting: Student

## 2024-08-10 VITALS — BP 129/72 | HR 86 | Temp 98.0°F | Ht 71.0 in | Wt 287.2 lb

## 2024-08-10 DIAGNOSIS — R112 Nausea with vomiting, unspecified: Secondary | ICD-10-CM | POA: Diagnosis not present

## 2024-08-10 DIAGNOSIS — F3341 Major depressive disorder, recurrent, in partial remission: Secondary | ICD-10-CM

## 2024-08-10 DIAGNOSIS — E7800 Pure hypercholesterolemia, unspecified: Secondary | ICD-10-CM

## 2024-08-10 DIAGNOSIS — I10 Essential (primary) hypertension: Secondary | ICD-10-CM

## 2024-08-10 DIAGNOSIS — Z131 Encounter for screening for diabetes mellitus: Secondary | ICD-10-CM

## 2024-08-10 MED ORDER — BUSPIRONE HCL 7.5 MG PO TABS: 7.5000 mg | ORAL_TABLET | Freq: Two times a day (BID) | ORAL | 2 refills | Status: AC

## 2024-08-10 MED ORDER — ROSUVASTATIN CALCIUM 20 MG PO TABS: 20.0000 mg | ORAL_TABLET | Freq: Every day | ORAL | 3 refills | Status: AC

## 2024-08-10 NOTE — Assessment & Plan Note (Signed)
 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  Suspect an anxiety component is being undertreated per my assessment above. - Continue Zoloft  100 daily - BuSpar  7.5 twice daily per above

## 2024-08-10 NOTE — Patient Instructions (Addendum)
 Increase your laxative - you can use miralax or prunes - target one soft bowel movement daily  Start buspar  - twice daily  Keep yourself hydrated, stand up slowly, modify your morning routine to change things up and try to break the current pattern.  Return if the issue remains for one month

## 2024-08-10 NOTE — Assessment & Plan Note (Signed)
 Pressures at goal today 129/72. -Continue amlodipine  10
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
## 2024-08-11 LAB — HEMOGLOBIN A1C
Est. average glucose Bld gHb Est-mCnc: 126 mg/dL
Hgb A1c MFr Bld: 6 % — ABNORMAL HIGH (ref 4.8–5.6)

## 2024-08-11 LAB — BASIC METABOLIC PANEL WITH GFR
BUN/Creatinine Ratio: 17 (ref 9–20)
BUN: 15 mg/dL (ref 6–24)
CO2: 21 mmol/L (ref 20–29)
Calcium: 9.8 mg/dL (ref 8.7–10.2)
Chloride: 101 mmol/L (ref 96–106)
Creatinine, Ser: 0.87 mg/dL (ref 0.76–1.27)
Glucose: 147 mg/dL — ABNORMAL HIGH (ref 70–99)
Potassium: 4.2 mmol/L (ref 3.5–5.2)
Sodium: 138 mmol/L (ref 134–144)
eGFR: 106 mL/min/1.73

## 2024-08-13 ENCOUNTER — Ambulatory Visit: Payer: Self-pay | Admitting: Student

## 2024-08-17 ENCOUNTER — Telehealth: Payer: Self-pay

## 2024-08-17 NOTE — Telephone Encounter (Signed)
 Prior Authorization for patient (Zepbound  2.5MG /0.5ML pen-injectors) came through on cover my meds was submitted with last office notes awaiting approval or denial.  KEY:[id number]

## 2024-08-20 ENCOUNTER — Telehealth: Payer: Self-pay

## 2024-08-20 NOTE — Telephone Encounter (Signed)
 Prior Authorization for patient (Testosterone  40.5 MG/2.5GM(1.62%) gel) came through on cover my meds was submitted with last office notes and labs awaiting approval or denial.  KEY:[id number]

## 2024-08-20 NOTE — Telephone Encounter (Signed)
 RE: [name] Date of Birth: February 24, 1976 Member ID: [id number] Dear [place]: Your request was denied Weve denied the medical services/items listed below requested by you or your doctor: ZEPBOUND  2.5MG /0.5ML Soln Auto-inj, a Formulary drug. Why did we deny your request? We denied the medical services/items listed above because: GLP-1 Receptor Agonists (Wegovy, Zepbound ) for Non-Weight Loss Indications criteria has not  been met. To meet our criteria, your doctor must tell us :  Your doctor is a specialist in treating sleep disorders, or they are working with one.   Provide chart notes that show you have a diagnosis of moderate to severe obstructive  sleep   apnea.  You are in a physician-directed weight loss program with a reduced-calorie diet and increased physical activity.  Chart notes that have the results of sleep testing, with an apnea hypopnea index (AHI),  with   or without Positive Airway Pressure (PAP) therapy.  A device report that shows you are using Positive Airway Pressure (PAP) therapy, or  you   have tried and failed PAP therapy for 70% of the night for 4 or more hours per night  for   at least 2 months.  You have tried sleep habit changes for at least 6 months (e.g., sleeping on your back,   avoiding alcohol and sedatives before bed). If you think you meet the missing criteria, please ask your doctor to send us  this information.

## 2024-08-20 NOTE — Telephone Encounter (Signed)
 Carlin Dubin (KeyBETHA SEVERS) PA Case ID #: [id number] Rx #: [id number] Need Help? Call us  at [phone] Outcome Approved today by [employer] HIX 2017 Approved. TESTOSTERONE  40.5 MG/2.5GM(1.62%) Gel is approved from 08/20/2024 to 11/18/2024. All strengths of the drug are approved. Effective Date: 08/20/2024 Authorization Expiration Date: 11/18/2024 Drug Testosterone  40.5 MG/2.5GM(1.62%) gel ePA cloud logo Form [employer] / Chief Of Staff Prior Authorization Form Original Claim Info 75,44 Prior Authorization Required. If memberis continuing therapy call health plan at [phone] for continuity of care authorization. Prescriber with no effective DEA License. Provide valid DEA licensee

## 2024-08-22 NOTE — Progress Notes (Signed)
 Internal Medicine Clinic Attending  Case discussed with the resident at the time of the visit.  We reviewed the resident's history and exam and pertinent patient test results.  I agree with the assessment, diagnosis, and plan of care documented in the resident's note.

## 2024-08-23 ENCOUNTER — Ambulatory Visit: Payer: Self-pay

## 2024-08-23 NOTE — Telephone Encounter (Signed)
 Called pt who stated he had swelling of his legs before but not for this length of time (7 days) and not as enlarged. His calves, feet and ankles are swollen, Denied warm to touch and sob. But noticed red pin pricks (dots).  He has an appt in am 1/23 with Dr Kem.

## 2024-08-23 NOTE — Telephone Encounter (Signed)
 FYI Only or Action Required?: FYI only for provider: appointment scheduled on 1/23.  Patient was last seen in primary care on 08/10/2024 by Harrie Bruckner, DO.  Called Nurse Triage reporting Leg Swelling.  Symptoms began a week ago.  Interventions attempted: Rest, hydration, or home remedies and Ice/heat application.  Symptoms are: gradually worsening.  Triage Disposition: See HCP Within 4 Hours (Or PCP Triage)  Patient/caregiver understands and will follow disposition?: Yes  Message from Chiquita SQUIBB sent at 08/23/2024  3:41 PM EST  Reason for Triage: Patients spouse is calling in stating that the patient began having swelling below the knees starting Monday and has gotten much worse. She states they are very swollen and hurt to touch.   Reason for Disposition  SEVERE leg swelling (e.g., swelling extends above knee, entire leg is swollen, weeping fluid)  Answer Assessment - Initial Assessment Questions Patient with swelling from the knee down Bilaterally since Last weds. Tight around the ankles and feet. Tender to touch. Denies redness to swelling but has little red dots over both legs. Tops of feet swole to the size of golfballs and ankles look like he is pregnant. Calves are mildly swollen Denies SOB, CP, Fever, redness, color or temp change distally. Denies knot or heat to the leg. Denies weight gain- states has actually lost weight with swelling.   Appt with PCP office in AM. Patient understands ED/UC precautions.    1. ONSET: When did the swelling start? (e.g., minutes, hours, days)     Last Weds  2. LOCATION: What part of the leg is swollen?  Are both legs swollen or just one leg?     From calf down  3. SEVERITY: How bad is the swelling? (e.g., localized; mild, moderate, severe)     moderate 4. REDNESS: Is there redness or signs of infection?     Denies redness 5. PAIN: Is the swelling painful to touch? If Yes, ask: How painful is it?   (Scale 1-10; mild,  moderate or severe)     2/10- tender to touch on the tight spots ankle and feet  6. FEVER: Do you have a fever? If Yes, ask: What is it, how was it measured, and when did it start?      Denies  7. CAUSE: What do you think is causing the leg swelling?     unsure 8. MEDICAL HISTORY: Do you have a history of blood clots (e.g., DVT), cancer, heart failure, kidney disease, or liver failure?     Denies  9. RECURRENT SYMPTOM: Have you had leg swelling before? If Yes, ask: When was the last time? What happened that time?     Yes years ago  10. OTHER SYMPTOMS: Do you have any other symptoms? (e.g., chest pain, difficulty breathing)       Denies  Protocols used: Leg Swelling and Edema-A-AH

## 2024-08-24 ENCOUNTER — Ambulatory Visit: Payer: Self-pay

## 2024-08-24 VITALS — BP 125/73 | HR 86 | Wt 271.0 lb

## 2024-08-24 DIAGNOSIS — R600 Localized edema: Secondary | ICD-10-CM | POA: Diagnosis not present

## 2024-08-24 MED ORDER — AMLODIPINE BESYLATE 5 MG PO TABS: 5.0000 mg | ORAL_TABLET | Freq: Every day | ORAL | 11 refills | Status: AC

## 2024-08-24 NOTE — Patient Instructions (Addendum)
 Today we discussed the following medical conditions and plan:   For your leg swelling, continue wearing the compression socks  We will decrease your amlodipine  to 5 mg   We will also order an ultrasound of your heart. I will also get a lab to check and see if you are holding on to fluid in your body   If your breathing gets worse at rest and you can't catch your breath or if you get worsening chest pain, please go to the emergency room   We look forward to seeing you next time. Please call our clinic at [phone] if you have any questions or concerns. The best time to call is Monday-Friday from 9am-4pm, but there is someone available 24/7. If you need medication refills, please notify your pharmacy one week in advance and they will send us  a request.   Thank you for trusting me with your care. Wishing you the best!   Trystyn Sitts D'Mello, DO  [facility]

## 2024-08-24 NOTE — Progress Notes (Signed)
 "  Acute Office Visit  Subjective:     Patient ID: [name], male    DOB: [birth year], 49 y.o.   MRN: [id number]  Chief Complaint  Patient presents with   Leg Swelling    Bilateral lower ext edema >1wk (last wed) Wears compression socks over the past 1-2 years Having pain     HPI Patient is in today for leg swelling that has been going on for the last couple of years. He states that he has been getting SOB with exertion, mostly when he is at work. Has history of OSA but is not on CPAP due to trouble sleeping after previous back surgery. At a previous appointment in October 2024, patient was recommended to use compression socks. Patient states that he has been wearing his compression socks at least 5 days a week. Patient came in for appointment today due to the persistent swelling that was making his legs hurt when the compression socks are on.He has not noticed any ulcers on his feet. He denies any chest pain or calf tenderness. States that a good weight for him is usually around is 277 lbs  which patient is under today. Patient was tried on lasix  in the past but states that it made him  feel weird.   ROS As noted in HPI.      Objective:    BP 125/73 (BP Location: Right Arm, Patient Position: Sitting, Cuff Size: Normal)   Pulse 86   Wt 271 lb (122.9 kg)   SpO2 99%   BMI 37.80 kg/m  Wt Readings from Last 3 Encounters:  08/24/24 271 lb (122.9 kg)  08/10/24 287 lb 3.2 oz (130.3 kg)  06/08/24 291 lb (132 kg)      Physical Exam Constitutional:      General: He is not in acute distress.    Appearance: He is obese.  Neck:     Comments: No jvd noted  Pulmonary:     Effort: Pulmonary effort is normal. No respiratory distress.     Breath sounds: Normal breath sounds. No rales.  Musculoskeletal:     Right lower leg: Edema present.     Left lower leg: Edema present.     Comments: Bilateral pedal edema noted that is not pitting  1+ edema noted to mid shin bilaterally    No calf tenderness and edema is symmetrical   Neurological:     Mental Status: He is alert.     No results found for any visits on 08/24/24.      Assessment & Plan:   Problem List Items Addressed This Visit       Other   Bilateral lower extremity edema - Primary   Relevant Orders   ECHOCARDIOGRAM COMPLETE   Pro b natriuretic peptide (BNP)    Meds ordered this encounter  Medications   amLODipine  (NORVASC ) 5 MG tablet    Sig: Take 1 tablet (5 mg total) by mouth daily.    Dispense:  30 tablet    Refill:  11   Assessment & Plan Bilateral lower extremity edema This problem has been going on for over a year. Patient was started out on amlodipine  5 mg at least a year ago, but patient reports that the swelling in his legs preceded this medication being added.  Patient does have a history of bicuspid aortic valve and was noted to have moderate aortic stenosis with a vmax of 3.39 m/s on echocardiogram in 2024. Also noted to have grade II  diastolic dysfunction on same echo with an EF of 65 to 70%. On exam patient does not have any rales, JVD that is noticeable. Denies any orthopnea and mostly has trouble sleeping on his back due to previous back surgery. Patient does have follow up with cardiology. He was supposed to get repeat echo in about 5 months, so will go ahead and get that at this visit. Could also have component of chronic venous stasis but would like to rule out any state of volume overload or worsening valve pathology that could be contributing to this.   Plan: Pro BNP ordered Follow echocardiogram  Will decrease amlodipine  10 mg to 5 mg daily  Continue wearing compression socks     Return if symptoms worsen or fail to improve.  Jesilyn Easom D'Mello, DO  Patient discussed with Dr. Francesco  "

## 2024-08-24 NOTE — Assessment & Plan Note (Addendum)
 This problem has been going on for over a year. Patient was started out on amlodipine  5 mg at least a year ago, but patient reports that the swelling in his legs preceded this medication being added.  Patient does have a history of bicuspid aortic valve and was noted to have moderate aortic stenosis with a vmax of 3.39 m/s on echocardiogram in 2024. Also noted to have grade II diastolic dysfunction on same echo with an EF of 65 to 70%. On exam patient does not have any rales, JVD that is noticeable. Denies any orthopnea and mostly has trouble sleeping on his back due to previous back surgery. Patient does have follow up with cardiology. He was supposed to get repeat echo in about 5 months, so will go ahead and get that at this visit. Could also have component of chronic venous stasis but would like to rule out any state of volume overload or worsening valve pathology that could be contributing to this.   Plan: Pro BNP ordered Follow echocardiogram  Will decrease amlodipine  10 mg to 5 mg daily  Continue wearing compression socks

## 2024-08-25 LAB — PRO B NATRIURETIC PEPTIDE: NT-Pro BNP: 74 pg/mL (ref 0–121)

## 2024-08-26 NOTE — Progress Notes (Signed)
 Internal Medicine Clinic Attending  Case discussed with the resident at the time of the visit.  We reviewed the resident's history and exam and pertinent patient test results.  I agree with the assessment, diagnosis, and plan of care documented in the resident's note.

## 2024-08-29 ENCOUNTER — Encounter (HOSPITAL_COMMUNITY): Payer: Self-pay

## 2024-08-29 ENCOUNTER — Ambulatory Visit (HOSPITAL_COMMUNITY)

## 2024-08-29 ENCOUNTER — Ambulatory Visit: Payer: Self-pay

## 2024-08-30 ENCOUNTER — Emergency Department (HOSPITAL_COMMUNITY)

## 2024-08-30 ENCOUNTER — Emergency Department (HOSPITAL_COMMUNITY)
Admission: EM | Admit: 2024-08-30 | Discharge: 2024-08-31 | Disposition: A | Discharge status: Home / Self Care | Attending: Emergency Medicine | Admitting: Emergency Medicine

## 2024-08-30 ENCOUNTER — Ambulatory Visit (HOSPITAL_COMMUNITY)
Admission: RE | Admit: 2024-08-30 | Discharge: 2024-08-30 | Disposition: A | Discharge status: Home / Self Care | Source: Ambulatory Visit | Attending: Internal Medicine | Admitting: Internal Medicine

## 2024-08-30 ENCOUNTER — Encounter (HOSPITAL_COMMUNITY): Payer: Self-pay

## 2024-08-30 ENCOUNTER — Other Ambulatory Visit: Payer: Self-pay

## 2024-08-30 DIAGNOSIS — I517 Cardiomegaly: Secondary | ICD-10-CM | POA: Insufficient documentation

## 2024-08-30 DIAGNOSIS — F17200 Nicotine dependence, unspecified, uncomplicated: Secondary | ICD-10-CM | POA: Insufficient documentation

## 2024-08-30 DIAGNOSIS — Q2381 Bicuspid aortic valve: Secondary | ICD-10-CM | POA: Insufficient documentation

## 2024-08-30 DIAGNOSIS — I77819 Aortic ectasia, unspecified site: Secondary | ICD-10-CM | POA: Insufficient documentation

## 2024-08-30 DIAGNOSIS — I77810 Thoracic aortic ectasia: Secondary | ICD-10-CM | POA: Insufficient documentation

## 2024-08-30 DIAGNOSIS — I351 Nonrheumatic aortic (valve) insufficiency: Secondary | ICD-10-CM | POA: Insufficient documentation

## 2024-08-30 DIAGNOSIS — Q231 Congenital insufficiency of aortic valve: Secondary | ICD-10-CM | POA: Diagnosis not present

## 2024-08-30 DIAGNOSIS — R600 Localized edema: Secondary | ICD-10-CM

## 2024-08-30 DIAGNOSIS — R0602 Shortness of breath: Secondary | ICD-10-CM | POA: Insufficient documentation

## 2024-08-30 DIAGNOSIS — Z79899 Other long term (current) drug therapy: Secondary | ICD-10-CM | POA: Diagnosis not present

## 2024-08-30 DIAGNOSIS — I10 Essential (primary) hypertension: Secondary | ICD-10-CM | POA: Diagnosis not present

## 2024-08-30 LAB — ECHOCARDIOGRAM COMPLETE
AR max vel: 1.02 cm2
AV Area VTI: 1.16 cm2
AV Area mean vel: 1.06 cm2
AV Mean grad: 29 mmHg
AV Peak grad: 49 mmHg
Ao pk vel: 3.5 m/s
Area-P 1/2: 4.6 cm2
Calc EF: 70.3 %
S' Lateral: 2.4 cm
Single Plane A2C EF: 72.3 %
Single Plane A4C EF: 66 %

## 2024-08-31 LAB — BASIC METABOLIC PANEL WITH GFR
Anion gap: 11 (ref 5–15)
BUN: 15 mg/dL (ref 6–20)
CO2: 28 mmol/L (ref 22–32)
Calcium: 8.8 mg/dL — ABNORMAL LOW (ref 8.9–10.3)
Chloride: 104 mmol/L (ref 98–111)
Creatinine, Ser: 0.79 mg/dL (ref 0.61–1.24)
GFR, Estimated: >60 mL/min
Glucose, Bld: 151 mg/dL — ABNORMAL HIGH (ref 70–99)
Potassium: 3.9 mmol/L (ref 3.5–5.1)
Sodium: 142 mmol/L (ref 135–145)

## 2024-08-31 LAB — CBC
HCT: 39.2 % (ref 39.0–52.0)
Hemoglobin: 13.4 g/dL (ref 13.0–17.0)
MCH: 28.6 pg (ref 26.0–34.0)
MCHC: 34.2 g/dL (ref 30.0–36.0)
MCV: 83.6 fL (ref 80.0–100.0)
Platelets: 227 10*3/uL (ref 150–400)
RBC: 4.69 MIL/uL (ref 4.22–5.81)
RDW: 13.9 % (ref 11.5–15.5)
WBC: 8.5 10*3/uL (ref 4.0–10.5)
nRBC: 0 % (ref 0.0–0.2)

## 2024-08-31 LAB — TROPONIN T, HIGH SENSITIVITY
Troponin T High Sensitivity: 11 ng/L (ref 0–19)
Troponin T High Sensitivity: 11 ng/L (ref 0–19)

## 2024-08-31 LAB — PRO BRAIN NATRIURETIC PEPTIDE: Pro Brain Natriuretic Peptide: 78.6 pg/mL

## 2024-08-31 MED ORDER — FUROSEMIDE 40 MG PO TABS: 40.0000 mg | ORAL_TABLET | Freq: Every day | ORAL | 0 refills | Status: DC

## 2024-08-31 NOTE — ED Triage Notes (Signed)
 Pt is coming in with shortness of breath, and chest tightness but no pain at this time. He does have some significant bilateral lower leg swelling, pt is otherwise stable at this time in triage.

## 2024-09-03 ENCOUNTER — Encounter: Payer: Self-pay | Admitting: *Deleted

## 2024-09-04 ENCOUNTER — Encounter: Payer: Self-pay | Admitting: Emergency Medicine

## 2024-09-04 ENCOUNTER — Ambulatory Visit: Admitting: Emergency Medicine

## 2024-09-04 VITALS — BP 149/85 | HR 84 | Ht 70.0 in | Wt 305.2 lb

## 2024-09-04 DIAGNOSIS — I351 Nonrheumatic aortic (valve) insufficiency: Secondary | ICD-10-CM

## 2024-09-04 DIAGNOSIS — R600 Localized edema: Secondary | ICD-10-CM

## 2024-09-04 DIAGNOSIS — E785 Hyperlipidemia, unspecified: Secondary | ICD-10-CM | POA: Diagnosis not present

## 2024-09-04 DIAGNOSIS — G4733 Obstructive sleep apnea (adult) (pediatric): Secondary | ICD-10-CM

## 2024-09-04 DIAGNOSIS — Q2381 Bicuspid aortic valve: Secondary | ICD-10-CM | POA: Diagnosis not present

## 2024-09-04 DIAGNOSIS — I10 Essential (primary) hypertension: Secondary | ICD-10-CM

## 2024-09-04 DIAGNOSIS — I350 Nonrheumatic aortic (valve) stenosis: Secondary | ICD-10-CM | POA: Diagnosis not present

## 2024-09-04 LAB — CBC

## 2024-09-04 MED ORDER — FUROSEMIDE 40 MG PO TABS: 40.0000 mg | ORAL_TABLET | Freq: Every day | ORAL | 3 refills | Status: AC

## 2024-09-04 MED ORDER — LOSARTAN POTASSIUM 100 MG PO TABS: 100.0000 mg | ORAL_TABLET | Freq: Every day | ORAL | 3 refills | Status: AC

## 2024-09-05 LAB — BASIC METABOLIC PANEL WITH GFR
BUN/Creatinine Ratio: 17 (ref 9–20)
BUN: 12 mg/dL (ref 6–24)
CO2: 22 mmol/L (ref 20–29)
Calcium: 9.1 mg/dL (ref 8.7–10.2)
Chloride: 104 mmol/L (ref 96–106)
Creatinine, Ser: 0.7 mg/dL — ABNORMAL LOW (ref 0.76–1.27)
Glucose: 131 mg/dL — ABNORMAL HIGH (ref 70–99)
Potassium: 4 mmol/L (ref 3.5–5.2)
Sodium: 141 mmol/L (ref 134–144)
eGFR: 113 mL/min/{1.73_m2}

## 2024-09-05 LAB — CBC
Hematocrit: 41.2 (ref 37.5–51.0)
Hemoglobin: 13.7 g/dL (ref 13.0–17.7)
MCH: 28.1 pg (ref 26.6–33.0)
MCHC: 33.3 g/dL (ref 31.5–35.7)
MCV: 84 fL (ref 79–97)
Platelets: 252 10*3/uL (ref 150–450)
RBC: 4.88 x10E6/uL (ref 4.14–5.80)
RDW: 13.6 (ref 11.6–15.4)
WBC: 7.9 10*3/uL (ref 3.4–10.8)

## 2024-09-06 ENCOUNTER — Ambulatory Visit: Payer: Self-pay | Admitting: Emergency Medicine

## 2024-09-11 ENCOUNTER — Ambulatory Visit (HOSPITAL_COMMUNITY): Admission: RE | Admit: 2024-09-11 | Source: Home / Self Care | Admitting: Cardiology

## 2024-09-11 ENCOUNTER — Encounter (HOSPITAL_COMMUNITY): Admission: RE | Payer: Self-pay | Source: Home / Self Care

## 2024-09-27 ENCOUNTER — Encounter

## 2024-11-02 ENCOUNTER — Ambulatory Visit: Admitting: Emergency Medicine
